# Patient Record
Sex: Male | Born: 1975
Health system: Southern US, Community
[De-identification: ages and names within clinical notes are randomized; demographics above are authoritative.]

## PROBLEM LIST (undated history)

## (undated) DIAGNOSIS — I10 Essential (primary) hypertension: Secondary | ICD-10-CM

## (undated) DIAGNOSIS — F429 Obsessive-compulsive disorder, unspecified: Secondary | ICD-10-CM

## (undated) DIAGNOSIS — E7800 Pure hypercholesterolemia, unspecified: Secondary | ICD-10-CM

## (undated) DIAGNOSIS — F319 Bipolar disorder, unspecified: Secondary | ICD-10-CM

## (undated) HISTORY — PX: LYMPH NODE BIOPSY: SHX201

---

## 2016-11-17 ENCOUNTER — Ambulatory Visit (INDEPENDENT_AMBULATORY_CARE_PROVIDER_SITE_OTHER): Payer: BLUE CROSS/BLUE SHIELD | Admitting: Psychiatry

## 2016-11-17 DIAGNOSIS — F422 Mixed obsessional thoughts and acts: Secondary | ICD-10-CM | POA: Diagnosis not present

## 2016-11-17 DIAGNOSIS — Z79899 Other long term (current) drug therapy: Secondary | ICD-10-CM | POA: Diagnosis not present

## 2016-11-17 DIAGNOSIS — F319 Bipolar disorder, unspecified: Secondary | ICD-10-CM | POA: Diagnosis not present

## 2016-11-17 DIAGNOSIS — Z818 Family history of other mental and behavioral disorders: Secondary | ICD-10-CM

## 2016-11-17 MED ORDER — FLUVOXAMINE MALEATE 100 MG PO TABS: 100.0000 mg | ORAL_TABLET | Freq: Two times a day (BID) | ORAL | 1 refills | Status: DC

## 2016-11-17 MED ORDER — TRAZODONE HCL 50 MG PO TABS: 50.0000 mg | ORAL_TABLET | Freq: Every evening | ORAL | 1 refills | Status: DC | PRN

## 2016-11-17 MED ORDER — LITHIUM CARBONATE ER 300 MG PO TBCR: 1200.0000 mg | EXTENDED_RELEASE_TABLET | Freq: Every day | ORAL | 1 refills | Status: DC

## 2016-11-17 NOTE — Progress Notes (Signed)
Psychiatric Initial Adult Assessment   Patient Identification: Cameron Steele MRN:  [id number] Date of Evaluation:  11/17/2016 Referral Source: self Chief Complaint:  bipolar med management Visit Diagnosis:    ICD-10-CM   1. Bipolar I disorder ([place]) F31.9 traZODone (DESYREL) 50 MG tablet    lithium carbonate (LITHOBID) 300 MG CR tablet    fluvoxaMINE (LUVOX) 100 MG tablet    Ambulatory referral to Psychology    TSH    T4, free    Testosterone Free, Profile I    Vitamin D 1,25 dihydroxy    CBC with Differential    Comprehensive metabolic panel    TSH    T4, free    Testosterone Free, Profile I    Vitamin D 1,25 dihydroxy    CBC with Differential    Comprehensive metabolic panel  2. Mixed obsessional thoughts and acts F42.2 Ambulatory referral to Psychology    TSH    T4, free    Testosterone Free, Profile I    Vitamin D 1,25 dihydroxy    CBC with Differential    Comprehensive metabolic panel    TSH    T4, free    Testosterone Free, Profile I    Vitamin D 1,25 dihydroxy    CBC with Differential    Comprehensive metabolic panel    History of Present Illness:  Cameron Steele is a 41 year old male with one psychiatric hospitalization in 2005 for bipolar mania with psychosis. His wife is present today for initial intake and he provided a very reliable and corroborated history of bipolar mania lasting about 1 week, including auditory and visual hallucinations, decreased need for sleep, increased goal-directed activity, paranoia, and grandiosity.  Since the hospitalization, he has remained on lithium 1200 mg nightly, and Luvox 100 mg twice daily. He has a long history of struggling with OCD since childhood, and as his mania symptoms improved, his outpatient psychiatrist eventually started him on Luvox for OCD symptoms. He describes a history of counting, and 5's, and checking. He describes mixed obsessional thoughts as well, and feeling things need to be "just so" otherwise he feels out of  balance and inefficient. He reports that the Luvox and lithium in combination have been very well for him, and tolerated without issue. He denies any suicide attempts and had passive suicidal thoughts after the acute manic episode and hospitalization.  He has a family history of bipolar disorder and his paternal grandmother. He reports that he has no other family history of mental illness. I spent time educating him and his wife about heritability of bipolar disorder, and signs and symptoms to be cognizant of as their children age. They have nonidentical twins, ages 55, and one 25-year-old.   I spent time with the patient reviewing some of his behaviors and strategies for coping with anxiety and day-to-day stresses. He struggles with very black and white thinking. I asked him to tell me about himself, he started with his Myers-Briggs identification, and had difficulty abstracting concepts of his identity of self, self-esteem, areas of strength and weakness. His wife reports that he is often like this, in terms of black and white thinking, not wanting to do certain things such as exercise unless he can commit to do it every day. He can become quite unsettled with taking on new activities and hobbies unless he is able to do them every single day.  I spent time with him discussing the utility of CBT for OCD symptoms, exposure and response prevention, and he was agreeable  to a referral for individual CBT. He has never had any meaningful and visual therapy, and met with a therapist only once in the past. I recommended we proceed with a short course of CBT, and made a referral to Dr. Cheryln Manly.    He was agreeable to continue on the current medications, and to follow up in 4 months or sooner if needed. We will also obtain laboratory studies for routine medication monitoring. I spent time reviewing safety issues, and resources should he have any acute crises.  I also spent time discussing the utility of  antipsychotics for acute bipolar depression, and the utility of ECT for acute bipolar depression if he comes to crisis in the future. I also spent time reviewing the risks and benefits of long-term lithium use, and considering some strategies in the future that would allow [place] to taper him off of lithium if tolerability issues arise.  Associated Signs/Symptoms: Depression Symptoms:  euthymic (Hypo) Manic Symptoms:  Impulsivity, Anxiety Symptoms:  Obsessive Compulsive Symptoms:   Checking, Counting,, Psychotic Symptoms:  none PTSD Symptoms: Negative  Past Psychiatric History: One psychiatric hospitalization in [place] in 2015, for acute mania with psychosis. No prior suicide attempts or other psychiatric hospitalizations. He had outpatient psychiatric management until he moved to [place] this year, he has been following up with his outpatient psychiatrist in [place] for the past years he has been looking for a new provider.  Previous Psychotropic Medications: Yes - lithium, Luvox, trazodone He had a past response of akathisia to risperidone  Substance Abuse History in the last 12 months:  No.  Consequences of Substance Abuse: Negative  Past Medical History: No past medical history on file. No past surgical history on file.  Family Psychiatric History: Family psychiatric history of bipolar disorder in his paternal grandmother  Family History: No family history on file.  Social History:   Social History   Social History  . Marital status: Married    Spouse name: N/A  . Number of children: N/A  . Years of education: N/A   Social History Main Topics  . Smoking status: Not on file  . Smokeless tobacco: Not on file  . Alcohol use Not on file  . Drug use: Unknown  . Sexual activity: Not on file   Other Topics Concern  . Not on file   Social History Narrative  . No narrative on file    Additional Social History: The patient is married 13 years, has 3  children, works for the [employer], in a management position. Financially secure. Currently in the [employer] program to obtain his master's degree  Allergies:  Not on File  Metabolic Disorder Labs: No results found for: HGBA1C, MPG No results found for: PROLACTIN No results found for: CHOL, TRIG, HDL, CHOLHDL, VLDL, LDLCALC   Current Medications: Current Outpatient Prescriptions  Medication Sig Dispense Refill  . fluvoxaMINE (LUVOX) 100 MG tablet Take 1 tablet (100 mg total) by mouth 2 (two) times daily. 180 tablet 1  . lithium carbonate (LITHOBID) 300 MG CR tablet Take 4 tablets (1,200 mg total) by mouth at bedtime. 360 tablet 1  . traZODone (DESYREL) 50 MG tablet Take 1 tablet (50 mg total) by mouth at bedtime as needed. 90 tablet 1   No current facility-administered medications for this visit.     Neurologic: Headache: Negative Seizure: Negative Paresthesias:Negative  Musculoskeletal: Strength & Muscle Tone: within normal limits Gait & Station: normal Patient leans: N/A  Psychiatric Specialty Exam:  Review of Systems  Constitutional: Negative.   HENT: Negative.   Respiratory: Negative.   Cardiovascular: Negative.   Gastrointestinal: Negative.   Musculoskeletal: Negative.   Neurological: Negative.   Psychiatric/Behavioral: Negative for depression, hallucinations, memory loss, substance abuse and suicidal ideas. The patient is not nervous/anxious and does not have insomnia.     There were no vitals taken for this visit.There is no height or weight on file to calculate BMI.  General Appearance: Casual and Well Groomed  Eye Contact:  Good  Speech:  Clear and Coherent  Volume:  Normal  Mood:  Euthymic  Affect:  Congruent  Thought Process:  Goal Directed  Orientation:  Full (Time, Place, and Person)  Thought Content:  Logical  Suicidal Thoughts:  No  Homicidal Thoughts:  No  Memory:  Immediate;   Good  Judgement:  Good  Insight:  Good   Psychomotor Activity:  Normal  Concentration:  Attention Span: Good  Recall:  Good  Fund of Knowledge:Good  Language: Good  Akathisia:  Negative  Handed:  Right  AIMS (if indicated):  0  Assets:  Communication Skills Desire for Improvement Financial Resources/Insurance Housing Intimacy Leisure Time Physical Health Resilience Social Support Talents/Skills Transportation Vocational/Educational  ADL's:  Intact  Cognition: WNL  Sleep:  6 hours with trazodone    Treatment Plan Summary: Kailyn Dubie is a 41 year old male with a psychiatric history very consistent with bipolar 1 disorder, with a fairly classical presentation of bipolar mania followed by periods of depression. He has a biological history of bipolar disorder in his paternal grandmother.  He has never had more than 1 episode of acute mania, which occurred 3 years ago. The vast majority of patients with bipolar disorder will experience a repeat episode of mania, so we will need to be cognizant of any acute mood or affect of changes. He appears to be stable on lithium and Luvox, so we will continue at the current doses and obtain laboratory monitoring. He presents with a fairly concrete and rigid sense of himself both internally and interpersonally, and struggles with comorbid OCD which appears to have been present since childhood. He has never had any psychotherapy for treatment of OCD, and would benefit from cognitive behavioral therapy. He is agreeable to the recommendations as discussed and does not present any acute safety issues or substance abuse. He has an excellent support system and consistent employment.  1. Bipolar I disorder ([place])   2. Mixed obsessional thoughts and acts    Continue lithium 1200 mg nightly Continue trazodone 50 mg nightly for sleep Continue Luvox 100 mg twice daily for OCD Obtain CMP, CBC, TSH, free T4, vitamin D, free testosterone RTC 4 months Referral for individual CBT  I spent 1 hour with  the patient in direct care and counseling. We discussed psychiatric medication management and treatment planning moving forward including initiation of individual therapy.    Aundra Dubin, MD 10/2/201810:00 AM

## 2016-11-17 NOTE — Addendum Note (Signed)
Addended by: Rosalita Levan on: 11/17/2016 04:29 PM   Modules accepted: Orders

## 2016-11-18 LAB — LITHIUM LEVEL: Lithium Lvl: 0.6 mmol/L (ref 0.6–1.2)

## 2016-11-20 LAB — CBC WITH DIFFERENTIAL/PLATELET
BASOS ABS: 0 10*3/uL (ref 0.0–0.2)
BASOS: 0 %
EOS (ABSOLUTE): 0.2 10*3/uL (ref 0.0–0.4)
Eos: 3 %
Hematocrit: 44.6 % (ref 37.5–51.0)
Hemoglobin: 15.1 g/dL (ref 13.0–17.7)
IMMATURE GRANS (ABS): 0 10*3/uL (ref 0.0–0.1)
IMMATURE GRANULOCYTES: 1 %
LYMPHS: 19 %
Lymphocytes Absolute: 1.5 10*3/uL (ref 0.7–3.1)
MCH: 28.8 pg (ref 26.6–33.0)
MCHC: 33.9 g/dL (ref 31.5–35.7)
MCV: 85 fL (ref 79–97)
MONOS ABS: 0.4 10*3/uL (ref 0.1–0.9)
Monocytes: 5 %
NEUTROS PCT: 72 %
Neutrophils Absolute: 5.6 10*3/uL (ref 1.4–7.0)
PLATELETS: 207 10*3/uL (ref 150–379)
RBC: 5.24 x10E6/uL (ref 4.14–5.80)
RDW: 13.6 % (ref 12.3–15.4)
WBC: 7.8 10*3/uL (ref 3.4–10.8)

## 2016-11-20 LAB — COMPREHENSIVE METABOLIC PANEL
A/G RATIO: 1.6 (ref 1.2–2.2)
ALT: 22 IU/L (ref 0–44)
AST: 26 IU/L (ref 0–40)
Albumin: 4.6 g/dL (ref 3.5–5.5)
Alkaline Phosphatase: 110 IU/L (ref 39–117)
BUN/Creatinine Ratio: 15 (ref 9–20)
BUN: 15 mg/dL (ref 6–24)
Bilirubin Total: 0.2 mg/dL (ref 0.0–1.2)
CALCIUM: 9.4 mg/dL (ref 8.7–10.2)
CHLORIDE: 101 mmol/L (ref 96–106)
CO2: 17 mmol/L — ABNORMAL LOW (ref 20–29)
Creatinine, Ser: 1.02 mg/dL (ref 0.76–1.27)
GFR calc Af Amer: 105 mL/min/{1.73_m2} (ref >59)
GFR, EST NON AFRICAN AMERICAN: 91 mL/min/{1.73_m2} (ref >59)
Globulin, Total: 2.8 g/dL (ref 1.5–4.5)
POTASSIUM: 4 mmol/L (ref 3.5–5.2)
Sodium: 143 mmol/L (ref 134–144)
TOTAL PROTEIN: 7.4 g/dL (ref 6.0–8.5)

## 2016-11-20 LAB — VITAMIN D 1,25 DIHYDROXY

## 2016-11-20 LAB — TESTOSTERONE FREE, PROFILE I

## 2016-11-20 LAB — TSH: TSH: 2.05 u[IU]/mL (ref 0.450–4.500)

## 2016-11-20 LAB — T4, FREE: FREE T4: 1.19 ng/dL (ref 0.82–1.77)

## 2016-11-23 LAB — TESTOSTERONE FREE, PROFILE I
ALBUMIN: 4.6 g/dL (ref 3.5–5.5)
SEX HORMONE BINDING: 45.1 nmol/L (ref 16.5–55.9)
TESTOST., FREE, CALC: 60.2 pg/mL (ref 30.3–183.2)
TESTOSTERONE: 375 ng/dL (ref 264–916)

## 2016-11-23 LAB — VITAMIN D 1,25 DIHYDROXY
VITAMIN D 1, 25 (OH) TOTAL: 50 pg/mL
VITAMIN D3 1, 25 (OH): 50 pg/mL

## 2016-11-23 LAB — SPECIMEN STATUS REPORT

## 2017-02-02 ENCOUNTER — Ambulatory Visit (HOSPITAL_COMMUNITY): Payer: BLUE CROSS/BLUE SHIELD | Admitting: Psychiatry

## 2017-04-26 ENCOUNTER — Encounter (HOSPITAL_COMMUNITY): Payer: Self-pay | Admitting: Psychiatry

## 2017-04-26 ENCOUNTER — Ambulatory Visit (INDEPENDENT_AMBULATORY_CARE_PROVIDER_SITE_OTHER): Payer: PRIVATE HEALTH INSURANCE | Admitting: Psychiatry

## 2017-04-26 VITALS — BP 114/78 | HR 62 | Ht 71.0 in | Wt 217.0 lb

## 2017-04-26 DIAGNOSIS — F422 Mixed obsessional thoughts and acts: Secondary | ICD-10-CM | POA: Diagnosis not present

## 2017-04-26 DIAGNOSIS — F319 Bipolar disorder, unspecified: Secondary | ICD-10-CM | POA: Diagnosis not present

## 2017-04-26 MED ORDER — FLUVOXAMINE MALEATE 100 MG PO TABS: 100.0000 mg | ORAL_TABLET | Freq: Every day | ORAL | 1 refills | Status: DC

## 2017-04-26 MED ORDER — TRAZODONE HCL 50 MG PO TABS: 50.0000 mg | ORAL_TABLET | Freq: Every evening | ORAL | 1 refills | Status: DC | PRN

## 2017-04-26 MED ORDER — LITHIUM CARBONATE ER 300 MG PO TBCR: 1200.0000 mg | EXTENDED_RELEASE_TABLET | Freq: Every day | ORAL | 1 refills | Status: DC

## 2017-04-26 MED ORDER — METFORMIN HCL 500 MG PO TABS: 500.0000 mg | ORAL_TABLET | Freq: Every day | ORAL | 1 refills | Status: DC

## 2017-04-26 NOTE — Progress Notes (Signed)
BH MD/PA/NP OP Progress Note  04/26/2017 3:32 PM Cameron PluckCasey Steele  MRN:  [id number]  Chief Complaint: med check  HPI: Patient presents with stable mood, stable sleep.  I spent time discussing some of the risks and benefits of weight gain associated with lithium.  We discussed the use of metformin to help with weight loss and diet reduction associated with medication induced weight gain.  He was agreeable to this.  He denies any acute safety issues.  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  Wife reports that she has no significant concerns at this time.  We spent time discussing the importance of balancing family, work, education.  Visit Diagnosis:    ICD-10-CM   1. Mixed obsessional thoughts and acts F42.2   2. Bipolar I disorder ([facility]) F31.9 lithium carbonate (LITHOBID) 300 MG CR tablet    fluvoxaMINE (LUVOX) 100 MG tablet    traZODone (DESYREL) 50 MG tablet    Past Psychiatric History: See intake H&P for full details. Reviewed, with no updates at this time.   Past Medical History: History reviewed. No pertinent past medical history. History reviewed. No pertinent surgical history.  Family Psychiatric History: See intake H&P for full details. Reviewed, with no updates at this time.   Family History: History reviewed. No pertinent family history.  Social History:  Social History   Socioeconomic History  . Marital status: Married    Spouse name: None  . Number of children: None  . Years of education: None  . Highest education level: None  Social Needs  . Financial resource strain: None  . Food insecurity - worry: None  . Food insecurity - inability: None  . Transportation needs - medical: None  . Transportation needs - non-medical: None  Occupational History  . None  Tobacco Use  . Smoking status: Never Smoker  . Smokeless tobacco: Never Used  Substance and Sexual Activity  . Alcohol  use: No    Frequency: Never  . Drug use: No  . Sexual activity: None  Other Topics Concern  . None  Social History Narrative  . None    Allergies: Not on File  Metabolic Disorder Labs: No results found for: HGBA1C, MPG No results found for: PROLACTIN No results found for: CHOL, TRIG, HDL, CHOLHDL, VLDL, LDLCALC Lab Results  Component Value Date   TSH 2.050 11/17/2016    Therapeutic Level Labs: Lab Results  Component Value Date   LITHIUM 0.6 11/17/2016   No results found for: VALPROATE No components found for:  CBMZ  Current Medications: Current Outpatient Medications  Medication Sig Dispense Refill  . fluvoxaMINE (LUVOX) 100 MG tablet Take 1 tablet (100 mg total) by mouth at bedtime. 90 tablet 1  . lithium carbonate (LITHOBID) 300 MG CR tablet Take 4 tablets (1,200 mg total) by mouth at bedtime. 360 tablet 1  . traZODone (DESYREL) 50 MG tablet Take 1 tablet (50 mg total) by mouth at bedtime as needed. 90 tablet 1  . metFORMIN (GLUCOPHAGE) 500 MG tablet Take 1 tablet (500 mg total) by mouth daily with breakfast. 90 tablet 1   No current facility-administered medications for this visit.      Musculoskeletal: Strength & Muscle Tone: within normal limits Gait & Station: normal Patient leans: N/A  Psychiatric Specialty Exam: ROS  Blood pressure 114/78, pulse 62, height 5\' 11"  (1.803 m), weight 217 lb (98.4 kg), SpO2 95 %.Body mass  index is 30.27 kg/m.  General Appearance: Casual and Well Groomed  Eye Contact:  Good  Speech:  Clear and Coherent and Normal Rate  Volume:  Normal  Mood:  Euthymic  Affect:  Appropriate and Congruent  Thought Process:  Goal Directed and Descriptions of Associations: Intact  Orientation:  Full (Time, Place, and Person)  Thought Content: Logical   Suicidal Thoughts:  No  Homicidal Thoughts:  No  Memory:  Immediate;   Good  Judgement:  Fair  Insight:  Fair  Psychomotor Activity:  Normal  Concentration:  Concentration: Good  Recall:   Good  Fund of Knowledge: Good  Language: Good  Akathisia:  Negative  Handed:  Right  AIMS (if indicated): not done  Assets:  Communication Skills Desire for Improvement Financial Resources/Insurance Housing Intimacy Leisure Time Physical Health Resilience Social Support Talents/Skills Transportation Vocational/Educational  ADL's:  Intact  Cognition: WNL  Sleep:  Good   Screenings:   Assessment and Plan:  Cameron Steele Presents with stable mood on the medication regimen as below.  He has had some weight gain associated with lithium, so we agreed to start metformin to help with metabolic symptoms.  Spent time discussing his work, family, and educational goals, and discussing the importance of balancing.  Discussed self-care including daily exercise.  No acute safety issues, we will follow-up in 3 months and recheck laboratory studies at that time.  1. Mixed obsessional thoughts and acts   2. Bipolar I disorder (H[facility])     Status of current problems: stable  Labs Ordered: No orders of the defined types were placed in this encounter.   Labs Reviewed: n/a  Collateral Obtained/Records Reviewed: wife is present, she is able to corroborate that he has been stable and not with any acute safety issues, medication adherent  Plan:  Continue lithium, luvox, trazodone Notably, he has been taking Luvox 100 mg nightly (instead of BID) and feels stable at that dose Initiate metformin 500 mg daily with breakfast  I spent 25 minutes with the patient in direct face-to-face clinical care.  Greater than 50% of this time was spent in counseling and coordination of care with the patient.    Burnard Leigh, MD 04/26/2017, 3:32 PM

## 2017-08-31 ENCOUNTER — Ambulatory Visit (HOSPITAL_COMMUNITY): Payer: PRIVATE HEALTH INSURANCE | Admitting: Psychiatry

## 2017-09-09 ENCOUNTER — Ambulatory Visit (INDEPENDENT_AMBULATORY_CARE_PROVIDER_SITE_OTHER): Payer: PRIVATE HEALTH INSURANCE | Admitting: Psychiatry

## 2017-09-09 VITALS — BP 140/90 | HR 90 | Ht 71.0 in | Wt 260.0 lb

## 2017-09-09 DIAGNOSIS — Z79899 Other long term (current) drug therapy: Secondary | ICD-10-CM

## 2017-09-09 DIAGNOSIS — F319 Bipolar disorder, unspecified: Secondary | ICD-10-CM | POA: Diagnosis not present

## 2017-09-09 MED ORDER — METFORMIN HCL 500 MG PO TABS: 500.0000 mg | ORAL_TABLET | Freq: Every day | ORAL | 1 refills | Status: AC

## 2017-09-09 MED ORDER — LITHIUM CARBONATE ER 300 MG PO TBCR: 1200.0000 mg | EXTENDED_RELEASE_TABLET | Freq: Every day | ORAL | 1 refills | Status: DC

## 2017-09-09 MED ORDER — TRAZODONE HCL 50 MG PO TABS: 50.0000 mg | ORAL_TABLET | Freq: Every evening | ORAL | 1 refills | Status: AC | PRN

## 2017-09-09 MED ORDER — FLUVOXAMINE MALEATE 100 MG PO TABS: 100.0000 mg | ORAL_TABLET | Freq: Every day | ORAL | 1 refills | Status: AC

## 2017-09-09 NOTE — Progress Notes (Signed)
BH MD/PA/NP OP Progress Note  09/10/2017 9:52 AM Cameron Steele  MRN:  [id number]  Chief Complaint: med check  HPI: Cameron Steele reports good mood stability, good tolerability of the medication regimen, and overall has no complaints.  His wife is present and reports that Cameron Steele has been doing great, his mood has been stable and he seems to be on a really good path over the past year.  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  He sleeps well at night with the trazodone, denies any acute concerns.  We agreed to obtain laboratory studies today for routine medication monitoring and we will follow-up in 3-4 months or sooner if needed.  Disclosed to patient that this Clinical research associate is leaving this practice at the end of August 2019, and patients always has the right to choose their provider. Reassured patient that office will work to provide smooth transition of care whether they wish to remain at this office, or to continue with this provider, or seek alternative care options in community.  They expressed understanding.  Visit Diagnosis:    ICD-10-CM   1. Encounter for long-term (current) use of medications Z79.899 Lithium level    Comprehensive metabolic panel    TSH + free T4    CBC with Differential    Vitamin D 1,25 dihydroxy    Vitamin D 1,25 dihydroxy    CBC with Differential    TSH + free T4    Comprehensive metabolic panel    Lithium level  2. Bipolar I disorder (HCC) F31.9 fluvoxaMINE (LUVOX) 100 MG tablet    traZODone (DESYREL) 50 MG tablet    lithium carbonate (LITHOBID) 300 MG CR tablet    metFORMIN (GLUCOPHAGE) 500 MG tablet    Lithium level    Comprehensive metabolic panel    TSH + free T4    CBC with Differential    Vitamin D 1,25 dihydroxy    Vitamin D 1,25 dihydroxy    CBC with Differential    TSH + free T4    Comprehensive metabolic panel    Lithium level   Past Psychiatric History: See intake H&P for full  details. Reviewed, with no updates at this time.  Past Medical History: No past medical history on file. No past surgical history on file.  Family Psychiatric History: See intake H&P for full details. Reviewed, with no updates at this time.  Family History: No family history on file.  Social History:  Social History   Socioeconomic History  . Marital status: Married    Spouse name: Not on file  . Number of children: Not on file  . Years of education: Not on file  . Highest education level: Not on file  Occupational History  . Not on file  Social Needs  . Financial resource strain: Not on file  . Food insecurity:    Worry: Not on file    Inability: Not on file  . Transportation needs:    Medical: Not on file    Non-medical: Not on file  Tobacco Use  . Smoking status: Never Smoker  . Smokeless tobacco: Never Used  Substance and Sexual Activity  . Alcohol use: No    Frequency: Never  . Drug use: No  . Sexual activity: Not on file  Lifestyle  . Physical activity:    Days per week: Not on file    Minutes per session: Not on file  .  Stress: Not on file  Relationships  . Social connections:    Talks on phone: Not on file    Gets together: Not on file    Attends religious service: Not on file    Active member of club or organization: Not on file    Attends meetings of clubs or organizations: Not on file    Relationship status: Not on file  Other Topics Concern  . Not on file  Social History Narrative  . Not on file    Allergies: Not on File  Metabolic Disorder Labs: No results found for: HGBA1C, MPG No results found for: PROLACTIN No results found for: CHOL, TRIG, HDL, CHOLHDL, VLDL, LDLCALC Lab Results  Component Value Date   TSH 3.000 09/09/2017   TSH 2.050 11/17/2016    Therapeutic Level Labs: Lab Results  Component Value Date   LITHIUM 0.6 09/09/2017   LITHIUM 0.6 11/17/2016   No results found for: VALPROATE No components found for:   CBMZ  Current Medications: Current Outpatient Medications  Medication Sig Dispense Refill  . fluvoxaMINE (LUVOX) 100 MG tablet Take 1 tablet (100 mg total) by mouth at bedtime. 90 tablet 1  . lithium carbonate (LITHOBID) 300 MG CR tablet Take 4 tablets (1,200 mg total) by mouth at bedtime. 360 tablet 1  . metFORMIN (GLUCOPHAGE) 500 MG tablet Take 1 tablet (500 mg total) by mouth daily with breakfast. 90 tablet 1  . traZODone (DESYREL) 50 MG tablet Take 1 tablet (50 mg total) by mouth at bedtime as needed. 90 tablet 1   No current facility-administered medications for this visit.      Musculoskeletal: Strength & Muscle Tone: within normal limits Gait & Station: normal Patient leans: N/A  Psychiatric Specialty Exam: ROS  Blood pressure 140/90, pulse 90, height 5\' 11"  (1.803 m), weight 260 lb (117.9 kg).Body mass index is 36.26 kg/m.  General Appearance: Casual and Well Groomed  Eye Contact:  Good  Speech:  Clear and Coherent and Normal Rate  Volume:  Normal  Mood:  Euthymic  Affect:  Appropriate and Congruent  Thought Process:  Goal Directed and Descriptions of Associations: Intact  Orientation:  Full (Time, Place, and Person)  Thought Content: Logical   Suicidal Thoughts:  No  Homicidal Thoughts:  No  Memory:  Immediate;   Good  Judgement:  Fair  Insight:  Fair  Psychomotor Activity:  Normal  Concentration:  Concentration: Good  Recall:  Good  Fund of Knowledge: Good  Language: Good  Akathisia:  Negative  Handed:  Right  AIMS (if indicated): not done  Assets:  Communication Skills Desire for Improvement Financial Resources/Insurance Housing Intimacy Leisure Time Physical Health Resilience Social Support Talents/Skills Transportation Vocational/Educational  ADL's:  Intact  Cognition: WNL  Sleep:  Good   Assessment and Plan:  Cameron Steele Presents with stable mood.  He has lost some weight with the addition of metformin.  He is overall quite stable in terms of  his mood and sleep symptoms, and we discussed a plan to continue on the current medication regimen for long-term care.  We will assess laboratory monitoring today for routine medication management.  1. Encounter for long-term (current) use of medications   2. Bipolar I disorder ([facility]CC)     Status of current problems: stable  Labs Ordered: Orders Placed This Encounter  Procedures  . Lithium level    Standing Status:   Future    Number of Occurrences:   1    Standing Expiration Date:  09/10/2018  . Comprehensive metabolic panel    Standing Status:   Future    Number of Occurrences:   1    Standing Expiration Date:   09/10/2018  . TSH + free T4    Standing Status:   Future    Number of Occurrences:   1    Standing Expiration Date:   09/10/2018  . CBC with Differential    Standing Status:   Future    Number of Occurrences:   1    Standing Expiration Date:   09/10/2018  . Vitamin D 1,25 dihydroxy    Standing Status:   Future    Number of Occurrences:   1    Standing Expiration Date:   09/10/2018   Labs Reviewed: n/a  Collateral Obtained/Records Reviewed: wife is present and reports things have been going quite well with him and his mood, and reports overall good stability  Plan:  Continue lithium, luvox, trazodone, luvox Continue metformin 500 mg nightly  Burnard Leigh, MD 09/10/2017, 9:52 AM

## 2017-09-15 LAB — CBC WITH DIFFERENTIAL/PLATELET
BASOS ABS: 0 10*3/uL (ref 0.0–0.2)
Basos: 0 %
EOS (ABSOLUTE): 0.3 10*3/uL (ref 0.0–0.4)
Eos: 4 %
HEMOGLOBIN: 15 g/dL (ref 13.0–17.7)
Hematocrit: 43.8 % (ref 37.5–51.0)
IMMATURE GRANS (ABS): 0 10*3/uL (ref 0.0–0.1)
IMMATURE GRANULOCYTES: 0 %
LYMPHS: 23 %
Lymphocytes Absolute: 1.7 10*3/uL (ref 0.7–3.1)
MCH: 29.1 pg (ref 26.6–33.0)
MCHC: 34.2 g/dL (ref 31.5–35.7)
MCV: 85 fL (ref 79–97)
Monocytes Absolute: 0.6 10*3/uL (ref 0.1–0.9)
Monocytes: 8 %
NEUTROS PCT: 65 %
Neutrophils Absolute: 4.9 10*3/uL (ref 1.4–7.0)
PLATELETS: 244 10*3/uL (ref 150–450)
RBC: 5.15 x10E6/uL (ref 4.14–5.80)
RDW: 13.9 % (ref 12.3–15.4)
WBC: 7.5 10*3/uL (ref 3.4–10.8)

## 2017-09-15 LAB — COMPREHENSIVE METABOLIC PANEL
ALK PHOS: 101 IU/L (ref 39–117)
ALT: 31 IU/L (ref 0–44)
AST: 23 IU/L (ref 0–40)
Albumin/Globulin Ratio: 2.3 — ABNORMAL HIGH (ref 1.2–2.2)
Albumin: 4.8 g/dL (ref 3.5–5.5)
BUN/Creatinine Ratio: 12 (ref 9–20)
BUN: 13 mg/dL (ref 6–24)
Bilirubin Total: 0.2 mg/dL (ref 0.0–1.2)
CALCIUM: 9.6 mg/dL (ref 8.7–10.2)
CO2: 19 mmol/L — AB (ref 20–29)
CREATININE: 1.13 mg/dL (ref 0.76–1.27)
Chloride: 105 mmol/L (ref 96–106)
GFR calc Af Amer: 92 mL/min/{1.73_m2} (ref >59)
GFR calc non Af Amer: 80 mL/min/{1.73_m2} (ref >59)
GLOBULIN, TOTAL: 2.1 g/dL (ref 1.5–4.5)
GLUCOSE: 95 mg/dL (ref 65–99)
Potassium: 4.5 mmol/L (ref 3.5–5.2)
SODIUM: 144 mmol/L (ref 134–144)
Total Protein: 6.9 g/dL (ref 6.0–8.5)

## 2017-09-15 LAB — TSH+FREE T4
FREE T4: 1.04 ng/dL (ref 0.82–1.77)
TSH: 3 u[IU]/mL (ref 0.450–4.500)

## 2017-09-15 LAB — VITAMIN D 1,25 DIHYDROXY
VITAMIN D3 1, 25 (OH): 27 pg/mL
Vitamin D 1, 25 (OH)2 Total: 32 pg/mL

## 2017-09-15 LAB — LITHIUM LEVEL: LITHIUM LVL: 0.6 mmol/L (ref 0.6–1.2)

## 2018-09-15 DIAGNOSIS — F3174 Bipolar disorder, in full remission, most recent episode manic: Secondary | ICD-10-CM | POA: Diagnosis not present

## 2018-10-21 DIAGNOSIS — Z5181 Encounter for therapeutic drug level monitoring: Secondary | ICD-10-CM | POA: Diagnosis not present

## 2018-12-25 DIAGNOSIS — S63502A Unspecified sprain of left wrist, initial encounter: Secondary | ICD-10-CM | POA: Diagnosis not present

## 2019-01-31 DIAGNOSIS — Z20828 Contact with and (suspected) exposure to other viral communicable diseases: Secondary | ICD-10-CM | POA: Diagnosis not present

## 2019-01-31 DIAGNOSIS — Z6829 Body mass index (BMI) 29.0-29.9, adult: Secondary | ICD-10-CM | POA: Diagnosis not present

## 2019-02-07 DIAGNOSIS — R519 Headache, unspecified: Secondary | ICD-10-CM | POA: Diagnosis not present

## 2019-02-07 DIAGNOSIS — Z20828 Contact with and (suspected) exposure to other viral communicable diseases: Secondary | ICD-10-CM | POA: Diagnosis not present

## 2019-02-07 DIAGNOSIS — R110 Nausea: Secondary | ICD-10-CM | POA: Diagnosis not present

## 2019-02-07 DIAGNOSIS — R6883 Chills (without fever): Secondary | ICD-10-CM | POA: Diagnosis not present

## 2019-02-15 DIAGNOSIS — Z20828 Contact with and (suspected) exposure to other viral communicable diseases: Secondary | ICD-10-CM | POA: Diagnosis not present

## 2019-03-13 DIAGNOSIS — F3174 Bipolar disorder, in full remission, most recent episode manic: Secondary | ICD-10-CM | POA: Diagnosis not present

## 2019-03-24 DIAGNOSIS — Z5181 Encounter for therapeutic drug level monitoring: Secondary | ICD-10-CM | POA: Diagnosis not present

## 2019-03-24 DIAGNOSIS — F429 Obsessive-compulsive disorder, unspecified: Secondary | ICD-10-CM | POA: Diagnosis not present

## 2019-03-24 DIAGNOSIS — F3174 Bipolar disorder, in full remission, most recent episode manic: Secondary | ICD-10-CM | POA: Diagnosis not present

## 2019-05-16 DIAGNOSIS — S20362A Insect bite (nonvenomous) of left front wall of thorax, initial encounter: Secondary | ICD-10-CM | POA: Diagnosis not present

## 2019-05-16 DIAGNOSIS — R21 Rash and other nonspecific skin eruption: Secondary | ICD-10-CM | POA: Diagnosis not present

## 2019-08-10 DIAGNOSIS — Z0000 Encounter for general adult medical examination without abnormal findings: Secondary | ICD-10-CM | POA: Diagnosis not present

## 2019-08-10 DIAGNOSIS — Z113 Encounter for screening for infections with a predominantly sexual mode of transmission: Secondary | ICD-10-CM | POA: Diagnosis not present

## 2019-08-10 DIAGNOSIS — R739 Hyperglycemia, unspecified: Secondary | ICD-10-CM | POA: Diagnosis not present

## 2019-08-10 DIAGNOSIS — Z13220 Encounter for screening for lipoid disorders: Secondary | ICD-10-CM | POA: Diagnosis not present

## 2019-08-10 DIAGNOSIS — Z1159 Encounter for screening for other viral diseases: Secondary | ICD-10-CM | POA: Diagnosis not present

## 2019-08-10 DIAGNOSIS — M25532 Pain in left wrist: Secondary | ICD-10-CM | POA: Diagnosis not present

## 2019-08-10 DIAGNOSIS — Z23 Encounter for immunization: Secondary | ICD-10-CM | POA: Diagnosis not present

## 2019-08-10 DIAGNOSIS — N529 Male erectile dysfunction, unspecified: Secondary | ICD-10-CM | POA: Diagnosis not present

## 2019-08-10 DIAGNOSIS — I10 Essential (primary) hypertension: Secondary | ICD-10-CM | POA: Diagnosis not present

## 2019-09-18 DIAGNOSIS — F3174 Bipolar disorder, in full remission, most recent episode manic: Secondary | ICD-10-CM | POA: Diagnosis not present

## 2019-10-03 DIAGNOSIS — E7800 Pure hypercholesterolemia, unspecified: Secondary | ICD-10-CM | POA: Diagnosis not present

## 2019-10-03 DIAGNOSIS — E786 Lipoprotein deficiency: Secondary | ICD-10-CM | POA: Diagnosis not present

## 2019-10-03 DIAGNOSIS — I10 Essential (primary) hypertension: Secondary | ICD-10-CM | POA: Diagnosis not present

## 2019-10-03 DIAGNOSIS — N529 Male erectile dysfunction, unspecified: Secondary | ICD-10-CM | POA: Diagnosis not present

## 2019-10-04 DIAGNOSIS — M25532 Pain in left wrist: Secondary | ICD-10-CM | POA: Insufficient documentation

## 2019-10-06 DIAGNOSIS — M25532 Pain in left wrist: Secondary | ICD-10-CM | POA: Diagnosis not present

## 2019-10-17 DIAGNOSIS — M25532 Pain in left wrist: Secondary | ICD-10-CM | POA: Diagnosis not present

## 2019-10-20 DIAGNOSIS — M25532 Pain in left wrist: Secondary | ICD-10-CM | POA: Diagnosis not present

## 2019-10-25 DIAGNOSIS — R05 Cough: Secondary | ICD-10-CM | POA: Diagnosis not present

## 2019-10-25 DIAGNOSIS — Z03818 Encounter for observation for suspected exposure to other biological agents ruled out: Secondary | ICD-10-CM | POA: Diagnosis not present

## 2019-10-25 DIAGNOSIS — J329 Chronic sinusitis, unspecified: Secondary | ICD-10-CM | POA: Diagnosis not present

## 2019-10-25 DIAGNOSIS — R0981 Nasal congestion: Secondary | ICD-10-CM | POA: Diagnosis not present

## 2019-10-25 DIAGNOSIS — J029 Acute pharyngitis, unspecified: Secondary | ICD-10-CM | POA: Diagnosis not present

## 2019-11-03 DIAGNOSIS — R509 Fever, unspecified: Secondary | ICD-10-CM | POA: Diagnosis not present

## 2019-11-03 DIAGNOSIS — I10 Essential (primary) hypertension: Secondary | ICD-10-CM | POA: Diagnosis not present

## 2019-11-04 ENCOUNTER — Other Ambulatory Visit: Payer: Self-pay

## 2019-11-04 ENCOUNTER — Emergency Department (HOSPITAL_COMMUNITY): Payer: BC Managed Care – PPO

## 2019-11-04 ENCOUNTER — Emergency Department (HOSPITAL_COMMUNITY)
Admission: EM | Admit: 2019-11-04 | Discharge: 2019-11-04 | Disposition: A | Discharge status: Home / Self Care | Payer: BC Managed Care – PPO | Attending: Emergency Medicine | Admitting: Emergency Medicine

## 2019-11-04 ENCOUNTER — Ambulatory Visit (HOSPITAL_COMMUNITY)
Admission: EM | Admit: 2019-11-04 | Discharge: 2019-11-04 | Disposition: A | Discharge status: Home / Self Care | Payer: Self-pay

## 2019-11-04 ENCOUNTER — Encounter (HOSPITAL_COMMUNITY): Payer: Self-pay | Admitting: Emergency Medicine

## 2019-11-04 DIAGNOSIS — Z20822 Contact with and (suspected) exposure to covid-19: Secondary | ICD-10-CM | POA: Insufficient documentation

## 2019-11-04 DIAGNOSIS — J069 Acute upper respiratory infection, unspecified: Secondary | ICD-10-CM | POA: Insufficient documentation

## 2019-11-04 DIAGNOSIS — J9811 Atelectasis: Secondary | ICD-10-CM | POA: Diagnosis not present

## 2019-11-04 DIAGNOSIS — R590 Localized enlarged lymph nodes: Secondary | ICD-10-CM | POA: Diagnosis not present

## 2019-11-04 DIAGNOSIS — Z79899 Other long term (current) drug therapy: Secondary | ICD-10-CM | POA: Insufficient documentation

## 2019-11-04 DIAGNOSIS — R509 Fever, unspecified: Secondary | ICD-10-CM | POA: Diagnosis not present

## 2019-11-04 DIAGNOSIS — R079 Chest pain, unspecified: Secondary | ICD-10-CM | POA: Diagnosis not present

## 2019-11-04 DIAGNOSIS — I10 Essential (primary) hypertension: Secondary | ICD-10-CM | POA: Diagnosis not present

## 2019-11-04 DIAGNOSIS — J029 Acute pharyngitis, unspecified: Secondary | ICD-10-CM | POA: Diagnosis not present

## 2019-11-04 DIAGNOSIS — R0602 Shortness of breath: Secondary | ICD-10-CM | POA: Diagnosis not present

## 2019-11-04 HISTORY — DX: Essential (primary) hypertension: I10

## 2019-11-04 HISTORY — DX: Pure hypercholesterolemia, unspecified: E78.00

## 2019-11-04 LAB — COMPREHENSIVE METABOLIC PANEL
ALT: 24 U/L (ref 0–44)
AST: 16 U/L (ref 15–41)
Albumin: 3.5 g/dL (ref 3.5–5.0)
Alkaline Phosphatase: 75 U/L (ref 38–126)
Anion gap: 9 (ref 5–15)
BUN: 10 mg/dL (ref 6–20)
CO2: 23 mmol/L (ref 22–32)
Calcium: 9.1 mg/dL (ref 8.9–10.3)
Chloride: 105 mmol/L (ref 98–111)
Creatinine, Ser: 1.2 mg/dL (ref 0.61–1.24)
GFR calc Af Amer: >60 mL/min (ref >60)
GFR calc non Af Amer: >60 mL/min (ref >60)
Glucose, Bld: 109 mg/dL — ABNORMAL HIGH (ref 70–99)
Potassium: 4.6 mmol/L (ref 3.5–5.1)
Sodium: 137 mmol/L (ref 135–145)
Total Bilirubin: 0.8 mg/dL (ref 0.3–1.2)
Total Protein: 7.2 g/dL (ref 6.5–8.1)

## 2019-11-04 LAB — LACTIC ACID, PLASMA: Lactic Acid, Venous: 0.7 mmol/L (ref 0.5–1.9)

## 2019-11-04 LAB — CBC
HCT: 44.7 % (ref 39.0–52.0)
Hemoglobin: 14.7 g/dL (ref 13.0–17.0)
MCH: 27.6 pg (ref 26.0–34.0)
MCHC: 32.9 g/dL (ref 30.0–36.0)
MCV: 84 fL (ref 80.0–100.0)
Platelets: 245 10*3/uL (ref 150–400)
RBC: 5.32 MIL/uL (ref 4.22–5.81)
RDW: 13.8 % (ref 11.5–15.5)
WBC: 10.5 10*3/uL (ref 4.0–10.5)
nRBC: 0 % (ref 0.0–0.2)

## 2019-11-04 LAB — MONONUCLEOSIS SCREEN: Mono Screen: NEGATIVE

## 2019-11-04 LAB — GROUP A STREP BY PCR: Group A Strep by PCR: NOT DETECTED

## 2019-11-04 LAB — SARS CORONAVIRUS 2 BY RT PCR (HOSPITAL ORDER, PERFORMED IN ~~LOC~~ HOSPITAL LAB): SARS Coronavirus 2: NEGATIVE

## 2019-11-04 MED ORDER — PREDNISONE 20 MG PO TABS
40.0000 mg | ORAL_TABLET | Freq: Once | ORAL | Status: AC
  Administered 2019-11-04: 40 mg via ORAL
  Filled 2019-11-04: qty 2

## 2019-11-04 MED ORDER — ACETAMINOPHEN 325 MG PO TABS
650.0000 mg | ORAL_TABLET | Freq: Once | ORAL | Status: AC | PRN
  Administered 2019-11-04: 650 mg via ORAL
  Filled 2019-11-04: qty 2

## 2019-11-04 MED ORDER — PENICILLIN V POTASSIUM 500 MG PO TABS: 500.0000 mg | ORAL_TABLET | Freq: Three times a day (TID) | ORAL | 0 refills | Status: DC

## 2019-11-04 MED ORDER — SODIUM CHLORIDE 0.9 % IV BOLUS
1000.0000 mL | Freq: Once | INTRAVENOUS | Status: AC
  Administered 2019-11-04: 1000 mL via INTRAVENOUS

## 2019-11-04 MED ORDER — OXYCODONE-ACETAMINOPHEN 5-325 MG PO TABS
2.0000 | ORAL_TABLET | Freq: Once | ORAL | Status: AC
  Administered 2019-11-04: 2 via ORAL
  Filled 2019-11-04: qty 2

## 2019-11-04 MED ORDER — IOHEXOL 350 MG/ML SOLN
75.0000 mL | Freq: Once | INTRAVENOUS | Status: AC | PRN
  Administered 2019-11-04: 75 mL via INTRAVENOUS

## 2019-11-04 MED ORDER — IBUPROFEN 800 MG PO TABS
800.0000 mg | ORAL_TABLET | Freq: Once | ORAL | Status: AC
  Administered 2019-11-04: 800 mg via ORAL
  Filled 2019-11-04: qty 1

## 2019-11-04 NOTE — ED Triage Notes (Signed)
Pt sitting in vehicle per wife; having difficulty ambulating due to extreme fatigue.  Had negative Covid and strep, but continues with fevers, body aches, sore throat.  States SaO2 was 91% at home yesterday.   SaO2 at present is 94% while sitting in car.  Pt alert.  Discussed with Colin Mulders, NP --  Pt to go to ED.  Pt and spouse verbalized understanding.

## 2019-11-04 NOTE — ED Triage Notes (Signed)
Pt reports fever, cough, sore throat, and generalized body aches x 11 days.  States he tested negative for strep and COVID 10 days ago.  Took ibuprofen around 2am.

## 2019-11-04 NOTE — Discharge Instructions (Addendum)
These continue to alternate Tylenol and ibuprofen every 4 hours.  Drink plenty of fluids.  Recheck with your doctor next week.  Return to ED if you are worse anytime

## 2019-11-04 NOTE — ED Provider Notes (Addendum)
[facility] EMERGENCY DEPARTMENT Provider Note   CSN: [id number] Arrival date & time: 11/04/19  1111     History Chief Complaint  Patient presents with  . COVID testing  . Sore Throat  . Generalized Body Aches  . Fever    Cameron Steele is a 44 y.o. male.  HPI 44 yo male presents with complaints of sore throat, body aches and fever for 11 days.  He was tested for covid and strep 10 days ago and negative.  He has received a covid vaccine.  He works Equities trader.  He is not a smoker. He has had some decreased appetite but no vomiting or diarrhea.  He denies any immunosuppression of known covid contacts. He feels improved when taking antipyretics but reports fever up to 103 when effects wane.  He denies productive cough or uti symptoms.     Past Medical History:  Diagnosis Date  . High cholesterol   . Hypertension     There are no problems to display for this patient.   History reviewed. No pertinent surgical history.     No family history on file.  Social History   Tobacco Use  . Smoking status: Never Smoker  . Smokeless tobacco: Never Used  Vaping Use  . Vaping Use: Never used  Substance Use Topics  . Alcohol use: No  . Drug use: No    Home Medications Prior to Admission medications   Medication Sig Start Date End Date Taking? Authorizing Provider  fluvoxaMINE (LUVOX) 100 MG tablet Take 1 tablet (100 mg total) by mouth at bedtime. 09/09/17   Eksir, Bo Mcclintock, MD  lithium carbonate (LITHOBID) 300 MG CR tablet Take 4 tablets (1,200 mg total) by mouth at bedtime. 09/09/17   Burnard Leigh, MD  metFORMIN (GLUCOPHAGE) 500 MG tablet Take 1 tablet (500 mg total) by mouth daily with breakfast. 09/09/17 09/09/18  Burnard Leigh, MD  traZODone (DESYREL) 50 MG tablet Take 1 tablet (50 mg total) by mouth at bedtime as needed. 09/09/17   Eksir, Bo Mcclintock, MD    Allergies    Patient has no allergy information on record.  Review of  Systems   Review of Systems  Physical Exam Updated Vital Signs BP 113/63   Pulse 80   Temp (!) 101 F (38.3 C) (Oral)   Resp 20   Wt 108.9 kg   SpO2 94%   BMI 33.47 kg/m   Physical Exam Vitals and nursing note reviewed.  Constitutional:      Appearance: He is well-developed.  HENT:     Head: Normocephalic and atraumatic.     Right Ear: Tympanic membrane normal.     Left Ear: Tympanic membrane normal.     Mouth/Throat:     Mouth: Mucous membranes are moist.     Tonsils: No tonsillar exudate or tonsillar abscesses.     Comments: Erythematous oropharynx without exudate Floor of mouth soft Voice normal Eyes:     Conjunctiva/sclera: Conjunctivae normal.  Cardiovascular:     Rate and Rhythm: Normal rate and regular rhythm.     Heart sounds: Normal heart sounds.  Pulmonary:     Effort: Pulmonary effort is normal.     Breath sounds: Normal breath sounds.  Abdominal:     General: Bowel sounds are normal.     Palpations: Abdomen is soft.  Musculoskeletal:     Cervical back: Normal range of motion and neck supple.  Skin:    General: Skin is warm  and dry.     Capillary Refill: Capillary refill takes less than 2 seconds.  Neurological:     General: No focal deficit present.     Mental Status: He is alert.  Psychiatric:        Mood and Affect: Mood normal.        Behavior: Behavior normal.     ED Results / Procedures / Treatments   Labs (all labs ordered are listed, but only abnormal results are displayed) Labs Reviewed  SARS CORONAVIRUS 2 BY RT PCR (HOSPITAL ORDER, PERFORMED IN [facility])  GROUP A STREP BY PCR  CBC  COMPREHENSIVE METABOLIC PANEL  MONONUCLEOSIS SCREEN    EKG None  Radiology CT Soft Tissue Neck W Contrast  Result Date: 11/04/2019 CLINICAL DATA:  44 year old male with fever cough sore throat and body ache. Negative for strep and COVID-19 10 days ago. EXAM: CT NECK WITH CONTRAST TECHNIQUE: Multidetector CT imaging of the neck was  performed using the standard protocol following the bolus administration of intravenous contrast. CONTRAST:  44mL OMNIPAQUE IOHEXOL 350 MG/ML SOLN COMPARISON:  CTA chest today reported separately today. FINDINGS: Pharynx and larynx: The glottis is closed. Laryngeal soft tissue contours remain within normal limits. Normal epiglottis. Mild enlargement of the lingual tonsil, palatine tonsils and adenoids are less pronounced and within normal limits. No discrete tonsillar hyperenhancement. Negative parapharyngeal and retropharyngeal spaces. Salivary glands: Negative sublingual space, submandibular glands and parotid glands. Thyroid: Negative. Lymph nodes: There is an increased number of subcentimeter lymph nodes in the bilateral level 2 and level 3 stations. Furthermore, the largest level 2 lymph nodes are 10-12 mm short axis. No cystic or necrotic nodes. The other nodal stations are relatively spared. Small nodes may be mildly increased also at the left level 4 station. Vascular: Major vascular structures in the neck and at the skull base appear patent and negative. Limited intracranial: Negative. Visualized orbits: Minimally included. Mastoids and visualized paranasal sinuses: Clear. Skeleton: No acute dental finding. Chronic ununited fracture or ossification center of the C7 through T2 spinous processes. No acute osseous abnormality identified. Upper chest: Reported separately today. IMPRESSION: 1. Reactive appearing bilateral cervical lymphadenopathy: increased number of predominantly subcentimeter nodes at the level 2 and 3 stations. In conjunction with borderline to mild tonsillar enlargement, the appearance is compatible with URI in this setting. 2. No discrete tonsillitis. No fluid or inflammation in the deep soft tissue spaces of the neck. 3.  CTA Chest today reported separately. Electronically Signed   By: Odessa Fleming M.D.   On: 11/04/2019 18:47   CT Angio Chest PE W and/or Wo Contrast  Result Date:  11/04/2019 CLINICAL DATA:  Chest pain shortness of breath pleural effusion EXAM: CT ANGIOGRAPHY CHEST WITH CONTRAST TECHNIQUE: Multidetector CT imaging of the chest was performed using the standard protocol during bolus administration of intravenous contrast. Multiplanar CT image reconstructions and MIPs were obtained to evaluate the vascular anatomy. CONTRAST:  42mL OMNIPAQUE IOHEXOL 350 MG/ML SOLN COMPARISON:  None. FINDINGS: Cardiovascular: There is suboptimal opacification of the main pulmonary artery, however no central pulmonary embolus is seen. Limited visualization of the segmental and subsegmental pulmonary arterial branches. The heart is normal in size. No pericardial effusion or thickening. No evidence right heart strain. There is normal three-vessel brachiocephalic anatomy without proximal stenosis. The thoracic aorta is normal in appearance. Mediastinum/Nodes: No hilar, mediastinal, or axillary adenopathy. Thyroid gland, trachea, and esophagus demonstrate no significant findings. Lungs/Pleura: Streaky atelectasis seen within the right mid lung. Mild  amount of ground-glass opacity at both lung bases. Upper Abdomen: No acute abnormalities present in the visualized portions of the upper abdomen. Musculoskeletal: No chest wall abnormality. No acute or significant osseous findings. Review of the MIP images confirms the above findings. IMPRESSION: Suboptimal opacification of the main pulmonary artery, however no central pulmonary embolism is seen. Limited visualization of the subsegmental and segmental pulmonary arteries. Streaky atelectasis in the right mid lung Electronically Signed   By: Jonna Clark M.D.   On: 11/04/2019 18:44   DG Chest Port 1 View  Result Date: 11/04/2019 CLINICAL DATA:  44 year old male with fever cough and sore throat EXAM: PORTABLE CHEST 1 VIEW COMPARISON:  None. FINDINGS: Cardiomediastinal silhouette within normal limits. Low lung volumes with linear opacities at the lung bases  representing atelectasis. No pneumothorax pleural effusion or confluent airspace disease IMPRESSION: Negative for acute cardiopulmonary disease Electronically Signed   By: Gilmer Mor D.O.   On: 11/04/2019 14:06    Procedures Procedures (including critical care time)  Medications Ordered in ED Medications  acetaminophen (TYLENOL) tablet 650 mg (650 mg Oral Given 11/04/19 1144)  oxyCODONE-acetaminophen (PERCOCET/ROXICET) 5-325 MG per tablet 2 tablet (2 tablets Oral Given 11/04/19 1351)  predniSONE (DELTASONE) tablet 40 mg (40 mg Oral Given 11/04/19 1351)    ED Course  I have reviewed the triage vital signs and the nursing notes.  Pertinent labs & imaging results that were available during my care of the patient were reviewed by me and considered in my medical decision making (see chart for details).    MDM Rules/Calculators/A&P                          44 yo male with fever, sore throat and myalgias.  Covid negative, strep and mono , wbc normal.  Patient with some moderatley low sats-92% and no infiltrate on cxr or other explanation for hypoxia or fevers- plan ct neck and chest to evaluate for infection and pe.  No evidence of PE or definitive infiltrate.  Throat CT reveals no abscesses but reactive adenopathy and inflammation consistent with tonsillitis.  Patient recently completed augmentin and sxs c.w. viral infection.  strict return precautions and close follow-up patient voices understanding of plan. Final Clinical Impression(s) / ED Diagnoses Final diagnoses:  Upper respiratory tract infection, unspecified type    Rx / DC Orders ED Discharge Orders    None       Margarita Grizzle, MD 11/04/19 1924    Margarita Grizzle, MD 11/04/19 1931

## 2019-11-05 ENCOUNTER — Ambulatory Visit (HOSPITAL_COMMUNITY): Payer: Self-pay

## 2019-11-20 DIAGNOSIS — J343 Hypertrophy of nasal turbinates: Secondary | ICD-10-CM | POA: Diagnosis not present

## 2019-11-20 DIAGNOSIS — Z8709 Personal history of other diseases of the respiratory system: Secondary | ICD-10-CM | POA: Diagnosis not present

## 2019-11-20 DIAGNOSIS — J029 Acute pharyngitis, unspecified: Secondary | ICD-10-CM | POA: Insufficient documentation

## 2020-01-15 DIAGNOSIS — F3174 Bipolar disorder, in full remission, most recent episode manic: Secondary | ICD-10-CM | POA: Diagnosis not present

## 2020-08-08 DIAGNOSIS — F3181 Bipolar II disorder: Secondary | ICD-10-CM | POA: Diagnosis not present

## 2020-08-08 DIAGNOSIS — G4700 Insomnia, unspecified: Secondary | ICD-10-CM | POA: Diagnosis not present

## 2020-08-08 DIAGNOSIS — Z5181 Encounter for therapeutic drug level monitoring: Secondary | ICD-10-CM | POA: Diagnosis not present

## 2020-08-08 DIAGNOSIS — F429 Obsessive-compulsive disorder, unspecified: Secondary | ICD-10-CM | POA: Diagnosis not present

## 2020-08-09 DIAGNOSIS — G4700 Insomnia, unspecified: Secondary | ICD-10-CM | POA: Diagnosis not present

## 2020-08-09 DIAGNOSIS — Z5181 Encounter for therapeutic drug level monitoring: Secondary | ICD-10-CM | POA: Diagnosis not present

## 2020-08-09 DIAGNOSIS — F3181 Bipolar II disorder: Secondary | ICD-10-CM | POA: Diagnosis not present

## 2020-08-09 DIAGNOSIS — F429 Obsessive-compulsive disorder, unspecified: Secondary | ICD-10-CM | POA: Diagnosis not present

## 2020-10-22 DIAGNOSIS — E291 Testicular hypofunction: Secondary | ICD-10-CM | POA: Diagnosis not present

## 2020-10-22 DIAGNOSIS — I10 Essential (primary) hypertension: Secondary | ICD-10-CM | POA: Diagnosis not present

## 2020-10-22 DIAGNOSIS — N529 Male erectile dysfunction, unspecified: Secondary | ICD-10-CM | POA: Diagnosis not present

## 2020-10-22 DIAGNOSIS — R7989 Other specified abnormal findings of blood chemistry: Secondary | ICD-10-CM | POA: Diagnosis not present

## 2020-10-22 DIAGNOSIS — E7800 Pure hypercholesterolemia, unspecified: Secondary | ICD-10-CM | POA: Diagnosis not present

## 2020-12-18 DIAGNOSIS — J0410 Acute tracheitis without obstruction: Secondary | ICD-10-CM | POA: Diagnosis not present

## 2020-12-18 DIAGNOSIS — J069 Acute upper respiratory infection, unspecified: Secondary | ICD-10-CM | POA: Diagnosis not present

## 2020-12-25 ENCOUNTER — Other Ambulatory Visit: Payer: Self-pay

## 2020-12-25 ENCOUNTER — Emergency Department (HOSPITAL_COMMUNITY)
Admission: EM | Admit: 2020-12-25 | Discharge: 2020-12-26 | Disposition: A | Discharge status: Home / Self Care | Payer: BLUE CROSS/BLUE SHIELD | Attending: Emergency Medicine | Admitting: Emergency Medicine

## 2020-12-25 ENCOUNTER — Emergency Department (HOSPITAL_COMMUNITY): Payer: BLUE CROSS/BLUE SHIELD

## 2020-12-25 ENCOUNTER — Encounter (HOSPITAL_COMMUNITY): Payer: Self-pay | Admitting: Emergency Medicine

## 2020-12-25 DIAGNOSIS — Z79899 Other long term (current) drug therapy: Secondary | ICD-10-CM | POA: Diagnosis not present

## 2020-12-25 DIAGNOSIS — J069 Acute upper respiratory infection, unspecified: Secondary | ICD-10-CM | POA: Insufficient documentation

## 2020-12-25 DIAGNOSIS — I10 Essential (primary) hypertension: Secondary | ICD-10-CM | POA: Diagnosis not present

## 2020-12-25 DIAGNOSIS — R0789 Other chest pain: Secondary | ICD-10-CM | POA: Diagnosis not present

## 2020-12-25 DIAGNOSIS — R059 Cough, unspecified: Secondary | ICD-10-CM | POA: Diagnosis not present

## 2020-12-25 DIAGNOSIS — R918 Other nonspecific abnormal finding of lung field: Secondary | ICD-10-CM | POA: Diagnosis not present

## 2020-12-25 DIAGNOSIS — R0600 Dyspnea, unspecified: Secondary | ICD-10-CM | POA: Diagnosis not present

## 2020-12-25 DIAGNOSIS — R0602 Shortness of breath: Secondary | ICD-10-CM | POA: Diagnosis not present

## 2020-12-25 LAB — BASIC METABOLIC PANEL
Anion gap: 8 (ref 5–15)
BUN: 14 mg/dL (ref 6–20)
CO2: 26 mmol/L (ref 22–32)
Calcium: 9.2 mg/dL (ref 8.9–10.3)
Chloride: 104 mmol/L (ref 98–111)
Creatinine, Ser: 1.29 mg/dL — ABNORMAL HIGH (ref 0.61–1.24)
GFR, Estimated: >60 mL/min (ref >60)
Glucose, Bld: 91 mg/dL (ref 70–99)
Potassium: 4.2 mmol/L (ref 3.5–5.1)
Sodium: 138 mmol/L (ref 135–145)

## 2020-12-25 LAB — CBC WITH DIFFERENTIAL/PLATELET
Abs Immature Granulocytes: 0.36 10*3/uL — ABNORMAL HIGH (ref 0.00–0.07)
Basophils Absolute: 0.1 10*3/uL (ref 0.0–0.1)
Basophils Relative: 1 %
Eosinophils Absolute: 0.2 10*3/uL (ref 0.0–0.5)
Eosinophils Relative: 2 %
HCT: 50.9 % (ref 39.0–52.0)
Hemoglobin: 16.7 g/dL (ref 13.0–17.0)
Immature Granulocytes: 3 %
Lymphocytes Relative: 20 %
Lymphs Abs: 2.2 10*3/uL (ref 0.7–4.0)
MCH: 28.8 pg (ref 26.0–34.0)
MCHC: 32.8 g/dL (ref 30.0–36.0)
MCV: 87.8 fL (ref 80.0–100.0)
Monocytes Absolute: 0.7 10*3/uL (ref 0.1–1.0)
Monocytes Relative: 7 %
Neutro Abs: 7.3 10*3/uL (ref 1.7–7.7)
Neutrophils Relative %: 67 %
Platelets: 330 10*3/uL (ref 150–400)
RBC: 5.8 MIL/uL (ref 4.22–5.81)
RDW: 12.9 % (ref 11.5–15.5)
WBC: 10.9 10*3/uL — ABNORMAL HIGH (ref 4.0–10.5)
nRBC: 0 % (ref 0.0–0.2)

## 2020-12-25 LAB — TROPONIN I (HIGH SENSITIVITY): Troponin I (High Sensitivity): 6 ng/L (ref <18)

## 2020-12-25 NOTE — ED Provider Notes (Signed)
MSE was initiated and I personally evaluated the patient and placed orders (if any) at  10:13 PM on December 25, 2020.  Patient had the Flu last diagnosed at Urgent Care. Treated, got better, then developed symptoms in the last 2 days of SOB, cough, achy. Went back to Urgent Care who said he had a "white out" lung and needed to come to the ED.   Today's Vitals   12/25/20 2112  BP: (!) 139/92  Pulse: 87  Resp: 20  Temp: 98.9 F (37.2 C)  TempSrc: Oral  SpO2: 94%   There is no height or weight on file to calculate BMI.  Nontoxic in appearance.  Lung sounds to all fields  The patient appears stable so that the remainder of the MSE may be completed by another provider.   Elpidio Anis, PA-C 12/25/20 2215    Linwood Dibbles, MD 12/25/20 2242

## 2020-12-25 NOTE — ED Triage Notes (Signed)
Pt sent here from urgent care for abnormal chest xray (pt seen at [facility]). Pt went to urgent care for shortness of breath and sore throat, pt had flu last week and had been feeling better, but symptoms came back Sunday. UC provider said he "can only see a portion of lung cavity".

## 2020-12-26 DIAGNOSIS — J069 Acute upper respiratory infection, unspecified: Secondary | ICD-10-CM | POA: Diagnosis not present

## 2020-12-26 LAB — TROPONIN I (HIGH SENSITIVITY): Troponin I (High Sensitivity): 3 ng/L (ref <18)

## 2020-12-26 MED ORDER — LIDOCAINE VISCOUS HCL 2 % MT SOLN: 15.0000 mL | OROMUCOSAL | 0 refills | Status: DC | PRN

## 2020-12-26 MED ORDER — DEXAMETHASONE SODIUM PHOSPHATE 10 MG/ML IJ SOLN
10.0000 mg | Freq: Once | INTRAMUSCULAR | Status: AC
  Administered 2020-12-26: 10 mg via INTRAMUSCULAR
  Filled 2020-12-26: qty 1

## 2020-12-26 NOTE — ED Provider Notes (Signed)
[facility] EMERGENCY DEPARTMENT Provider Note   CSN: [id number] Arrival date & time: 12/25/20  1922     History Chief Complaint  Patient presents with   Shortness of Breath    Cameron Steele is a 45 y.o. male.  Patient referred to the emergency department by urgent care.  Patient was diagnosed with influenza last week.  He was also told he had "tracheitis" and was started on prednisone, antibiotics and Tussionex.  He reports he felt better for a few days and then started feeling ill again.  Patient primarily complaining of sore throat currently.  He went to urgent care today to get rechecked and was told that his chest x-ray was grossly abnormal and he needed to go immediately to the emergency department.      Past Medical History:  Diagnosis Date   High cholesterol    Hypertension     There are no problems to display for this patient.   History reviewed. No pertinent surgical history.     History reviewed. No pertinent family history.  Social History   Tobacco Use   Smoking status: Never   Smokeless tobacco: Never  Vaping Use   Vaping Use: Never used  Substance Use Topics   Alcohol use: No   Drug use: No    Home Medications Prior to Admission medications   Medication Sig Start Date End Date Taking? Authorizing Provider  fluvoxaMINE (LUVOX) 100 MG tablet Take 1 tablet (100 mg total) by mouth at bedtime. 09/09/17   Eksir, Bo Mcclintock, MD  lithium carbonate (LITHOBID) 300 MG CR tablet Take 4 tablets (1,200 mg total) by mouth at bedtime. 09/09/17   Burnard Leigh, MD  metFORMIN (GLUCOPHAGE) 500 MG tablet Take 1 tablet (500 mg total) by mouth daily with breakfast. 09/09/17 09/09/18  Burnard Leigh, MD  traZODone (DESYREL) 50 MG tablet Take 1 tablet (50 mg total) by mouth at bedtime as needed. 09/09/17   Eksir, Bo Mcclintock, MD    Allergies    Patient has no known allergies.  Review of Systems   Review of Systems  HENT:  Positive  for sore throat.   Respiratory:  Positive for cough.   All other systems reviewed and are negative.  Physical Exam Updated Vital Signs BP 130/83   Pulse 75   Temp 98.8 F (37.1 C) (Oral)   Resp (!) 24   SpO2 94%   Physical Exam Vitals and nursing note reviewed.  Constitutional:      General: He is not in acute distress.    Appearance: Normal appearance. He is well-developed.  HENT:     Head: Normocephalic and atraumatic.     Right Ear: Hearing normal.     Left Ear: Hearing normal.     Nose: Nose normal.  Eyes:     Conjunctiva/sclera: Conjunctivae normal.     Pupils: Pupils are equal, round, and reactive to light.  Cardiovascular:     Rate and Rhythm: Regular rhythm.     Heart sounds: S1 normal and S2 normal. No murmur heard.   No friction rub. No gallop.  Pulmonary:     Effort: Pulmonary effort is normal. No respiratory distress.     Breath sounds: Normal breath sounds.  Chest:     Chest wall: No tenderness.  Abdominal:     General: Bowel sounds are normal.     Palpations: Abdomen is soft.     Tenderness: There is no abdominal tenderness. There is no guarding or  rebound. Negative signs include Murphy's sign and McBurney's sign.     Hernia: No hernia is present.  Musculoskeletal:        General: Normal range of motion.     Cervical back: Normal range of motion and neck supple.  Skin:    General: Skin is warm and dry.     Findings: No rash.  Neurological:     Mental Status: He is alert and oriented to person, place, and time.     GCS: GCS eye subscore is 4. GCS verbal subscore is 5. GCS motor subscore is 6.     Cranial Nerves: No cranial nerve deficit.     Sensory: No sensory deficit.     Coordination: Coordination normal.  Psychiatric:        Speech: Speech normal.        Behavior: Behavior normal.        Thought Content: Thought content normal.    ED Results / Procedures / Treatments   Labs (all labs ordered are listed, but only abnormal results are  displayed) Labs Reviewed  CBC WITH DIFFERENTIAL/PLATELET - Abnormal; Notable for the following components:      Result Value   WBC 10.9 (*)    Abs Immature Granulocytes 0.36 (*)    All other components within normal limits  BASIC METABOLIC PANEL - Abnormal; Notable for the following components:   Creatinine, Ser 1.29 (*)    All other components within normal limits  TROPONIN I (HIGH SENSITIVITY)  TROPONIN I (HIGH SENSITIVITY)    EKG EKG Interpretation  Date/Time:  Wednesday December 25 2020 22:04:23 EST Ventricular Rate:  90 PR Interval:  156 QRS Duration: 104 QT Interval:  352 QTC Calculation: 430 R Axis:   61 Text Interpretation: Normal sinus rhythm Normal ECG Confirmed by Gilda Crease [phone]) on 12/26/2020 12:32:22 AM  Radiology DG Chest 2 View  Result Date: 12/25/2020 CLINICAL DATA:  Abnormal chest radiograph. EXAM: CHEST - 2 VIEW COMPARISON:  Chest radiograph dated 11/04/2019. FINDINGS: Mild eventration of the right hemidiaphragm. No focal consolidation, pleural effusion, pneumothorax. The cardiac silhouette is within limits. No acute osseous pathology. IMPRESSION: No active cardiopulmonary disease. Electronically Signed   By: Elgie Collard M.D.   On: 12/25/2020 22:39    Procedures Procedures   Medications Ordered in ED Medications - No data to display  ED Course  I have reviewed the triage vital signs and the nursing notes.  Pertinent labs & imaging results that were available during my care of the patient were reviewed by me and considered in my medical decision making (see chart for details).    MDM Rules/Calculators/A&P                           Patient appears well.  He is in no distress.  His lung examination is normal, no wheezing, normal air movement.  Remainder of his vital signs and examination are normal.  Oropharyngeal examination is unremarkable, no swelling, exudate.  An x-ray was performed today.  It is normal.  The only abnormality I  can see is elevation of the right hemidiaphragm, but this is chronic for him.  No pneumothorax, mass, pneumonia.  No evidence of congestive heart failure.  Its not clear what the concern was at urgent care.  I have reviewed that x-ray as well and it looks similar to the x-ray performed here at the emergency department.  No further investigation or treatment is necessary based  on this x-ray.  Final Clinical Impression(s) / ED Diagnoses Final diagnoses:  Upper respiratory tract infection, unspecified type    Rx / DC Orders ED Discharge Orders     None        Gilda Crease, MD 12/26/20 [phone]

## 2020-12-26 NOTE — ED Notes (Addendum)
Pt states that he was treated for inflamation in his throat about a week ago with steroids, antibiotics and cough syrup from PCP.

## 2021-01-01 ENCOUNTER — Ambulatory Visit (INDEPENDENT_AMBULATORY_CARE_PROVIDER_SITE_OTHER): Payer: BLUE CROSS/BLUE SHIELD | Admitting: Internal Medicine

## 2021-01-01 ENCOUNTER — Encounter: Payer: Self-pay | Admitting: Internal Medicine

## 2021-01-01 ENCOUNTER — Other Ambulatory Visit: Payer: Self-pay

## 2021-01-01 VITALS — BP 118/72 | HR 86 | Ht 71.0 in | Wt 255.0 lb

## 2021-01-01 DIAGNOSIS — R1011 Right upper quadrant pain: Secondary | ICD-10-CM | POA: Diagnosis not present

## 2021-01-01 DIAGNOSIS — R0602 Shortness of breath: Secondary | ICD-10-CM | POA: Diagnosis not present

## 2021-01-01 DIAGNOSIS — R053 Chronic cough: Secondary | ICD-10-CM

## 2021-01-01 NOTE — Patient Instructions (Addendum)
Please schedule follow up scheduled with myself in  1 month.   Before your next visit I would like you to have: Full set of PFTs - 1 hour  I have ordered an ultrasound of your gall bladder - will contact you with results, and also when it is scheduled for you.

## 2021-01-01 NOTE — Progress Notes (Signed)
Cameron Steele    [id number]    Jan 14, 1976  Primary Care Physician:Hagler, Fleet Contras, MD  Referring Physician: Aliene Beams, MD [id number] [place],  [place] [address] Reason for Consultation: shortness of breath, abnormal chest xray Date of Consultation: 01/01/2021  Chief complaint:   Chief Complaint  Patient presents with   Consult    Referred by PA, SOB during exertion and rest     HPI:  Last week went to urgent care and had a chest xray and it showed diagraphmatic eventration. He is here to discuss this.   Also notes as he has gotten older it takes him longer to get over a URI.  He has chronic cough for the past couple of years. Worse over time. Worse in the morning, wakes himself and people up in his sleep.   Had a similar episode last year which resulted in an ED visit. Needs abx for a URI at least once a year.   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   He has associated shortness of breath.   He notes exertional dyspnea and coughing with playing sports in high school. He describes himself as very active.  Was hospitalized for pneumonia when he was 5.  Does not take any breathing treatments or inhalers.  Has been prescribed albuterol before He isn't sure if prednisone helps or not.    He has constant pain in his right side which is dull and feels like a stitch in his side. Chronic. Not worse with inspiration. Also having nausea with it. Worse with food. Also having heart burn over the past couple of years. He is on nexium.    Social history:  Occupation: Film/video editor  Exposures:  lives at home with 3 kids, cats and dogs.  Smoking history: never smoker, passive smoke exposure in childhood.   Social History   Occupational History   Not on file  Tobacco Use   Smoking status: Never   Smokeless tobacco: Never  Vaping Use   Vaping Use: Never used  Substance and Sexual Activity    Alcohol use: No   Drug use: No   Sexual activity: Not on file    Relevant family history:  Family History  Problem Relation Age of Onset   Asthma Father     Past Medical History:  Diagnosis Date   High cholesterol    Hypertension     Past Surgical History:  Procedure Laterality Date   LYMPH NODE BIOPSY     inguinal, benign     Physical Exam: Blood pressure 118/72, pulse 86, height 5\' 11"  (1.803 m), weight 255 lb (115.7 kg), SpO2 96 %. Gen:      No acute distress ENT:  mild erythema, no nasal polyps, mucus membranes moist Lungs:    No increased respiratory effort, symmetric chest wall excursion, clear to auscultation bilaterally, no wheezes or crackles CV:         Regular rate and rhythm; no murmurs, rubs, or gallops.  No pedal edema Abd:      + bowel sounds; soft, non-tender; no distension, obese, no RUQ ttp MSK: no acute synovitis of DIP or PIP joints, no mechanics hands.  Skin:      Warm and dry; no rashes Neuro: normal speech, no focal facial asymmetry Psych: alert and oriented x3, normal mood and affect   Data Reviewed/Medical Decision Making:  Independent interpretation of tests: Imaging:  Review of patient's Chest xray Nov 2022 images revealed no acute process, chronic elevation of right hemidiaphragm. The patient's images have been independently reviewed by me.    PFTs: None on file No flowsheet data found.  Labs:  Lab Results  Component Value Date   WBC 10.9 (H) 12/25/2020   HGB 16.7 12/25/2020   HCT 50.9 12/25/2020   MCV 87.8 12/25/2020   PLT 330 12/25/2020   Lab Results  Component Value Date   NA 138 12/25/2020   K 4.2 12/25/2020   CL 104 12/25/2020   CO2 26 12/25/2020     Immunization status:  Immunization History  Administered Date(s) Administered   Janssen (J&J) SARS-COV-2 Vaccination 07/27/2019   Td 08/08/1990   Tdap 08/10/2019     I reviewed prior external note(s) from PCP, ED  I reviewed the result(s) of the labs and imaging  as noted above.   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  RUQ pain concerning for biliary colic  Plan/Recommendations: Will obtain RUQ ultrasound - if abnormal will get in touch with Dr. Tracie Harrier in case he needs referral to GI or surgery.   Chronic cough - differential includes reflux, asthma.  Will obtain full set of PFTs.  Will hold off on empiric inhaler therapy since he doesn't perceive benefit from steroids and inhalers.   Discussed right elevation hemi-diaphragm. Likely chronic and post-viral, will use PFTs to check for restriction, likely this is just an incidental finding for him. Discussed indications for plication surgery as well.   We discussed disease management and progression at length today.    Return to Care: Return in about 4 weeks (around 01/29/2021).  Durel Salts, MD Pulmonary and Critical Care Medicine [facility] Office:[phone]  CC: Aliene Beams, MD

## 2021-01-08 ENCOUNTER — Other Ambulatory Visit: Payer: Self-pay

## 2021-01-08 ENCOUNTER — Ambulatory Visit (HOSPITAL_COMMUNITY)
Admission: RE | Admit: 2021-01-08 | Discharge: 2021-01-08 | Disposition: A | Discharge status: Home / Self Care | Payer: BC Managed Care – PPO | Source: Ambulatory Visit | Attending: Internal Medicine | Admitting: Internal Medicine

## 2021-01-08 DIAGNOSIS — K8020 Calculus of gallbladder without cholecystitis without obstruction: Secondary | ICD-10-CM | POA: Diagnosis not present

## 2021-01-08 DIAGNOSIS — K760 Fatty (change of) liver, not elsewhere classified: Secondary | ICD-10-CM | POA: Diagnosis not present

## 2021-01-08 DIAGNOSIS — R1011 Right upper quadrant pain: Secondary | ICD-10-CM

## 2021-02-05 ENCOUNTER — Other Ambulatory Visit: Payer: Self-pay

## 2021-02-05 ENCOUNTER — Ambulatory Visit (INDEPENDENT_AMBULATORY_CARE_PROVIDER_SITE_OTHER): Payer: BC Managed Care – PPO | Admitting: Internal Medicine

## 2021-02-05 DIAGNOSIS — R0602 Shortness of breath: Secondary | ICD-10-CM

## 2021-02-05 NOTE — Progress Notes (Signed)
PFT done today. 

## 2021-02-21 ENCOUNTER — Ambulatory Visit (INDEPENDENT_AMBULATORY_CARE_PROVIDER_SITE_OTHER): Payer: BC Managed Care – PPO | Admitting: Internal Medicine

## 2021-02-21 ENCOUNTER — Other Ambulatory Visit: Payer: Self-pay

## 2021-02-21 ENCOUNTER — Encounter: Payer: Self-pay | Admitting: Internal Medicine

## 2021-02-21 VITALS — BP 98/60 | HR 90 | Temp 98.1°F | Ht 71.0 in | Wt 239.2 lb

## 2021-02-21 DIAGNOSIS — J4520 Mild intermittent asthma, uncomplicated: Secondary | ICD-10-CM | POA: Diagnosis not present

## 2021-02-21 MED ORDER — ALBUTEROL SULFATE HFA 108 (90 BASE) MCG/ACT IN AERS: 2.0000 | INHALATION_SPRAY | Freq: Four times a day (QID) | RESPIRATORY_TRACT | 5 refills | Status: DC | PRN

## 2021-02-21 MED ORDER — ADVAIR HFA 115-21 MCG/ACT IN AERO: 2.0000 | INHALATION_SPRAY | Freq: Two times a day (BID) | RESPIRATORY_TRACT | 12 refills | Status: DC

## 2021-02-21 NOTE — Patient Instructions (Addendum)
Please schedule follow up scheduled with myself in 4 months.  If my schedule is not open yet, we will contact you with a reminder closer to that time. Please call [phone] if you haven't heard from [place] a month before.    Start advair 2 puffs as needed when you get shortness of breath. Don't take this more than twice a day. Gargle after use.   Take the albuterol rescue inhaler every 4 to 6 hours as needed for wheezing or shortness of breath. You can also take it 15 minutes before exercise or exertional activity. Side effects include heart racing or pounding, jitters or anxiety. If you have a history of an irregular heart rhythm, it can make this worse. Can also give some patients a hard time sleeping.  To inhale the aerosol using an inhaler, follow these steps:  Remove the protective dust cap from the end of the mouthpiece. If the dust cap was not placed on the mouthpiece, check the mouthpiece for dirt or other objects. Be sure that the canister is fully and firmly inserted in the mouthpiece. 2. If you are using the inhaler for the first time or if you have not used the inhaler in more than 14 days, you will need to prime it. You may also need to prime the inhaler if it has been dropped. Ask your pharmacist or check the manufacturer's information if this happens. To prime the inhaler, shake it well and then press down on the canister 4 times to release 4 sprays into the air, away from your face. Be careful not to get albuterol in your eyes. 3. Shake the inhaler well. 4. Breathe out as completely as possible through your mouth. 4. Hold the canister with the mouthpiece on the bottom, facing you and the canister pointing upward. Place the open end of the mouthpiece into your mouth. Close your lips tightly around the mouthpiece. 6. Breathe in slowly and deeply through the mouthpiece.At the same time, press down once on the container to spray the medication into your mouth. 7. Try to hold your breath  for 10 seconds. remove the inhaler, and breathe out slowly. 8. If you were told to use 2 puffs, wait 1 minute and then repeat steps 3-7. 9. Replace the protective cap on the inhaler. 10. Clean your inhaler regularly. Follow the manufacturer's directions carefully and ask your doctor or pharmacist if you have any questions about cleaning your inhaler.  Check the back of the inhaler to keep track of the total number of doses left on the inhaler.

## 2021-02-21 NOTE — Progress Notes (Signed)
The patient has been prescribed the inhaler advair. Inhaler technique was demonstrated to patient. The patient subsequently demonstrated correct technique.  

## 2021-02-21 NOTE — Progress Notes (Signed)
Cameron Steele    HW:[id number]    November 11, 1975  Primary Care Physician:Hagler, Apolonio Schneiders, MD Date of Appointment: 02/21/2021 Established Patient Visit  Chief complaint:   Chief Complaint  Patient presents with   Follow-up    F/u after PFT.  No concerns.     HPI: Cameron Steele is a 46 y.o. man here with shortness of breath and elevation right hemidiaphragm.   Interval Updates: Here for follow up after PFTs. He is feeling well. On most days can go to the gym and work out. Symptoms of chest tightness and difficulty breathing have sudden onset and resolve with time. He has not perceived albuterol inhaler benefit in the past. Also notes that when he gets URI, it takes him much longer to recover.   Wife notes he has taken child's nebulizer treatment in the past and it has helped.   I have reviewed the patient's family social and past medical history and updated as appropriate.   Past Medical History:  Diagnosis Date   High cholesterol    Hypertension     Past Surgical History:  Procedure Laterality Date   LYMPH NODE BIOPSY     inguinal, benign    Family History  Problem Relation Age of Onset   Asthma Father     Social History   Occupational History   Not on file  Tobacco Use   Smoking status: Never   Smokeless tobacco: Never  Vaping Use   Vaping Use: Never used  Substance and Sexual Activity   Alcohol use: No   Drug use: No   Sexual activity: Not on file     Physical Exam: Blood pressure 98/60, pulse 90, temperature 98.1 F (36.7 C), temperature source Oral, height 5\' 11"  (1.803 m), weight 239 lb 3.2 oz (108.5 kg), SpO2 96 %.  Gen:      No acute distress ENT:  no nasal polyps, mucus membranes moist Lungs:    No increased respiratory effort, symmetric chest wall excursion, clear to auscultation bilaterally, no wheezes or crackles CV:         Regular rate and rhythm; no murmurs, rubs, or gallops.  No pedal edema   Data Reviewed: Imaging: I have personally  reviewed the Nov 2022 chest xray with mild eventration of right hemidiaphragm   RUQ u/s shows hepatic steatosis, no cholelithiasis.   PFTs:  PFT Results Latest Ref Rng & Units 02/05/2021  FVC-Predicted Pre % 99  FVC-Post L 5.74  FVC-Predicted Post % 109  Pre FEV1/FVC % % 85  Post FEV1/FCV % % 89  FEV1-Pre L 4.47  FEV1-Predicted Pre % 107  FEV1-Post L 5.10  DLCO uncorrected ml/min/mmHg 37.59  DLCO UNC% % 122  DLCO corrected ml/min/mmHg 35.64  DLCO COR %Predicted % 116  DLVA Predicted % 111  TLC L 7.57  TLC % Predicted % 107  RV % Predicted % 96   I have personally reviewed the patient's PFTs and normal pulmonary function. There is a significant response to bronchodilator administration. DLCO is elevated.   Labs: Lab Results  Component Value Date   WBC 10.9 (H) 12/25/2020   HGB 16.7 12/25/2020   HCT 50.9 12/25/2020   MCV 87.8 12/25/2020   PLT 330 12/25/2020     Immunization status: Immunization History  Administered Date(s) Administered   Janssen (J&J) SARS-COV-2 Vaccination 07/27/2019, 02/23/2020   Td 08/08/1990   Tdap 08/10/2019     Assessment:  Shortness of breath Right hemidiaphragm eventration Mild  intermittent asthma  Plan/Recommendations: Suspect mild intermittent asthma based on symptoms, pfts, family history of asthma. Trial ICS-LABA prn. He will keep track of his symptoms. Will give him an albuterol mdi as well given significant BD response.    Return to Care: Return in about 4 months (around 06/21/2021).   Lenice Llamas, MD Pulmonary and [place]

## 2021-02-24 LAB — PULMONARY FUNCTION TEST
DL/VA % pred: 111 %
DL/VA: 5.03 ml/min/mmHg/L
DLCO cor % pred: 116 %
DLCO cor: 35.64 ml/min/mmHg
DLCO unc % pred: 122 %
DLCO unc: 37.59 ml/min/mmHg
FEF 25-75 Post: 7.5 L/sec
FEF 25-75 Pre: 4.91 L/sec
FEF2575-%Change-Post: 52 %
FEF2575-%Pred-Post: 198 %
FEF2575-%Pred-Pre: 130 %
FEV1-%Change-Post: 14 %
FEV1-%Pred-Post: 123 %
FEV1-%Pred-Pre: 107 %
FEV1-Post: 5.1 L
FEV1-Pre: 4.47 L
FEV1FVC-%Change-Post: 4 %
FEV1FVC-%Pred-Pre: 107 %
FEV6-%Change-Post: 7 %
FEV6-%Pred-Post: 110 %
FEV6-%Pred-Pre: 102 %
FEV6-Post: 5.66 L
FEV6-Pre: 5.25 L
FEV6FVC-%Change-Post: 0 %
FEV6FVC-%Pred-Post: 103 %
FEV6FVC-%Pred-Pre: 102 %
FVC-%Change-Post: 9 %
FVC-%Pred-Post: 109 %
FVC-%Pred-Pre: 99 %
FVC-Post: 5.74 L
Post FEV1/FVC ratio: 89 %
Post FEV6/FVC ratio: 100 %
Pre FEV1/FVC ratio: 85 %
Pre FEV6/FVC Ratio: 100 %
RV % pred: 96 %
RV: 1.89 L
TLC % pred: 107 %
TLC: 7.57 L

## 2021-04-29 DIAGNOSIS — Z113 Encounter for screening for infections with a predominantly sexual mode of transmission: Secondary | ICD-10-CM | POA: Diagnosis not present

## 2021-04-29 DIAGNOSIS — E7800 Pure hypercholesterolemia, unspecified: Secondary | ICD-10-CM | POA: Diagnosis not present

## 2021-04-29 DIAGNOSIS — I10 Essential (primary) hypertension: Secondary | ICD-10-CM | POA: Diagnosis not present

## 2021-04-29 DIAGNOSIS — R946 Abnormal results of thyroid function studies: Secondary | ICD-10-CM | POA: Diagnosis not present

## 2021-04-29 DIAGNOSIS — N529 Male erectile dysfunction, unspecified: Secondary | ICD-10-CM | POA: Diagnosis not present

## 2021-04-29 DIAGNOSIS — Z0000 Encounter for general adult medical examination without abnormal findings: Secondary | ICD-10-CM | POA: Diagnosis not present

## 2021-07-16 ENCOUNTER — Ambulatory Visit: Payer: BC Managed Care – PPO | Admitting: Podiatry

## 2021-07-16 ENCOUNTER — Other Ambulatory Visit: Payer: Self-pay | Admitting: Podiatry

## 2021-07-16 ENCOUNTER — Ambulatory Visit (INDEPENDENT_AMBULATORY_CARE_PROVIDER_SITE_OTHER): Payer: BC Managed Care – PPO

## 2021-07-16 DIAGNOSIS — M7751 Other enthesopathy of right foot: Secondary | ICD-10-CM | POA: Diagnosis not present

## 2021-07-16 DIAGNOSIS — M779 Enthesopathy, unspecified: Secondary | ICD-10-CM | POA: Diagnosis not present

## 2021-07-16 DIAGNOSIS — M79671 Pain in right foot: Secondary | ICD-10-CM

## 2021-07-22 DIAGNOSIS — R948 Abnormal results of function studies of other organs and systems: Secondary | ICD-10-CM | POA: Diagnosis not present

## 2021-07-22 DIAGNOSIS — E349 Endocrine disorder, unspecified: Secondary | ICD-10-CM | POA: Diagnosis not present

## 2021-07-22 DIAGNOSIS — N5201 Erectile dysfunction due to arterial insufficiency: Secondary | ICD-10-CM | POA: Diagnosis not present

## 2021-07-22 NOTE — Progress Notes (Signed)
Subjective:  Patient ID: Cameron Steele, male    DOB: [birth year],  MRN: [id number]  No chief complaint on file.   46 y.o. male presents with the above complaint.  Patient presents with complaint of right hallux interphalangeal joint pain.  Patient has been going for 1 month is progressive gotten worse there is limited range of motion.  Hurts with ambulation.  He wanted to get it evaluated.  He denies any other acute complaints.  He states it hurts with ambulation.  Pain scale is 6 out of 10.  It is little bit better today.  He wanted to get it evaluated   Review of Systems: Negative except as noted in the HPI. Denies N/V/F/Ch.  Past Medical History:  Diagnosis Date   High cholesterol    Hypertension     Current Outpatient Medications:    albuterol (VENTOLIN HFA) 108 (90 Base) MCG/ACT inhaler, Inhale 2 puffs into the lungs every 6 (six) hours as needed for wheezing or shortness of breath., Disp: 18 g, Rfl: 5   atorvastatin (LIPITOR) 40 MG tablet, 40 mg., Disp: , Rfl:    esomeprazole (NEXIUM) 40 MG capsule, Take 40 mg by mouth., Disp: , Rfl:    fluticasone-salmeterol (ADVAIR HFA) 115-21 MCG/ACT inhaler, Inhale 2 puffs into the lungs 2 (two) times daily., Disp: 1 each, Rfl: 12   fluvoxaMINE (LUVOX) 100 MG tablet, Take 1 tablet (100 mg total) by mouth at bedtime., Disp: 90 tablet, Rfl: 1   lithium carbonate (LITHOBID) 300 MG CR tablet, Take 4 tablets (1,200 mg total) by mouth at bedtime., Disp: 360 tablet, Rfl: 1   losartan (COZAAR) 50 MG tablet, 50 mg., Disp: , Rfl:    metFORMIN (GLUCOPHAGE) 500 MG tablet, Take 1 tablet (500 mg total) by mouth daily with breakfast., Disp: 90 tablet, Rfl: 1   traZODone (DESYREL) 50 MG tablet, Take 1 tablet (50 mg total) by mouth at bedtime as needed., Disp: 90 tablet, Rfl: 1  Social History   Tobacco Use  Smoking Status Never  Smokeless Tobacco Never    No Known Allergies Objective:  There were no vitals filed for this visit. There is no height or  weight on file to calculate BMI. Constitutional Well developed. Well nourished.  Vascular Dorsalis pedis pulses palpable bilaterally. Posterior tibial pulses palpable bilaterally. Capillary refill normal to all digits.  No cyanosis or clubbing noted. Pedal hair growth normal.  Neurologic Normal speech. Oriented to person, place, and time. Epicritic sensation to light touch grossly present bilaterally.  Dermatologic Nails well groomed and normal in appearance. No open wounds. No skin lesions.  Orthopedic: Pain on palpation of right hallux IPJ joint pain with range of motion of the joint.  No crepitus or deep intra-articular pain noted.  Pain on palpation to the joint.  No extensor or flexor tendinitis noted.  No pain at the metatarsophalangeal joint   Radiographs: 3 views of skeletally mature the right foot: No osteoarthritic changes noted.  Small uneven joint space narrowing noted on the medial side.  No fractures noted.).  Noted. Assessment:   1. Capsulitis of toe, right    Plan:  Patient was evaluated and treated and all questions answered.  Right hallux IPJ capsulitis -I explained the patient the etiology of capsulitis and worse treatment options were discussed.  Given the amount of pain that he is having I believe would benefit from steroid injection help decrease acute inflammatory component associate with pain.  Patient agrees with plan like to proceed with steroid injection -  A steroid injection was performed at right IPJ hallux using 1% plain Lidocaine and 10 mg of Kenalog. This was well tolerated.   No follow-ups on file.

## 2021-12-02 ENCOUNTER — Ambulatory Visit: Payer: BC Managed Care – PPO | Admitting: Podiatry

## 2021-12-02 DIAGNOSIS — M7751 Other enthesopathy of right foot: Secondary | ICD-10-CM | POA: Diagnosis not present

## 2021-12-02 NOTE — Progress Notes (Signed)
Subjective:  Patient ID: Cameron Steele, male    DOB: [birth year],  MRN: [id number]  Chief Complaint  Patient presents with   Toe Pain    Pt stated that his toe pain is not much better     46 y.o. male presents with the above complaint.  Patient presents with right hallux interphalangeal joint pain.  Patient states that the injection helped some but still not much better.  He would like to discuss next treatment plan.  He denies any other acute complaints.   Review of Systems: Negative except as noted in the HPI. Denies N/V/F/Ch.  Past Medical History:  Diagnosis Date   High cholesterol    Hypertension     Current Outpatient Medications:    albuterol (VENTOLIN HFA) 108 (90 Base) MCG/ACT inhaler, Inhale 2 puffs into the lungs every 6 (six) hours as needed for wheezing or shortness of breath., Disp: 18 g, Rfl: 5   atorvastatin (LIPITOR) 40 MG tablet, 40 mg., Disp: , Rfl:    esomeprazole (NEXIUM) 40 MG capsule, Take 40 mg by mouth., Disp: , Rfl:    fluticasone-salmeterol (ADVAIR HFA) 115-21 MCG/ACT inhaler, Inhale 2 puffs into the lungs 2 (two) times daily., Disp: 1 each, Rfl: 12   fluvoxaMINE (LUVOX) 100 MG tablet, Take 1 tablet (100 mg total) by mouth at bedtime., Disp: 90 tablet, Rfl: 1   lithium carbonate (LITHOBID) 300 MG CR tablet, Take 4 tablets (1,200 mg total) by mouth at bedtime., Disp: 360 tablet, Rfl: 1   losartan (COZAAR) 50 MG tablet, 50 mg., Disp: , Rfl:    metFORMIN (GLUCOPHAGE) 500 MG tablet, Take 1 tablet (500 mg total) by mouth daily with breakfast., Disp: 90 tablet, Rfl: 1   traZODone (DESYREL) 50 MG tablet, Take 1 tablet (50 mg total) by mouth at bedtime as needed., Disp: 90 tablet, Rfl: 1  Social History   Tobacco Use  Smoking Status Never  Smokeless Tobacco Never    No Known Allergies Objective:  There were no vitals filed for this visit. There is no height or weight on file to calculate BMI. Constitutional Well developed. Well nourished.  Vascular Dorsalis  pedis pulses palpable bilaterally. Posterior tibial pulses palpable bilaterally. Capillary refill normal to all digits.  No cyanosis or clubbing noted. Pedal hair growth normal.  Neurologic Normal speech. Oriented to person, place, and time. Epicritic sensation to light touch grossly present bilaterally.  Dermatologic Nails well groomed and normal in appearance. No open wounds. No skin lesions.  Orthopedic: Pain on palpation of right hallux IPJ joint pain with range of motion of the joint.  No crepitus or deep intra-articular pain noted.  Pain on palpation to the joint.  No extensor or flexor tendinitis noted.  No pain at the metatarsophalangeal joint   Radiographs: 3 views of skeletally mature the right foot: No osteoarthritic changes noted.  Small uneven joint space narrowing noted on the medial side.  No fractures noted.).  Noted. Assessment:   1. Capsulitis of toe, right     Plan:  Patient was evaluated and treated and all questions answered.  Right hallux IPJ capsulitis -I explained the patient the etiology of capsulitis and worse treatment options were discussed.   -Clinically at this time he has failed steroid injection he would benefit from a cam boot immobilization.  I also believe he will benefit from an MRI evaluation of the toe given the amount of pain that he is having. -Cam boot was dispensed -MRI was ordered to rule out arthritis/capsulitis/flexor  tendinitis   No follow-ups on file.

## 2021-12-12 ENCOUNTER — Ambulatory Visit
Admission: RE | Admit: 2021-12-12 | Discharge: 2021-12-12 | Disposition: A | Discharge status: Home / Self Care | Payer: BC Managed Care – PPO | Source: Ambulatory Visit | Attending: Podiatry | Admitting: Podiatry

## 2021-12-12 DIAGNOSIS — M7989 Other specified soft tissue disorders: Secondary | ICD-10-CM | POA: Diagnosis not present

## 2021-12-12 DIAGNOSIS — M7751 Other enthesopathy of right foot: Secondary | ICD-10-CM

## 2021-12-17 ENCOUNTER — Telehealth: Payer: Self-pay | Admitting: Podiatry

## 2021-12-17 NOTE — Telephone Encounter (Signed)
Pts wife left message yesterday at 503pm stating pt had mri of toe and results are in [place] and they were not sure if they would get a call to discuss or if they needed an appt. Please advise?

## 2021-12-17 NOTE — Telephone Encounter (Signed)
Pts wife called back stating the pt had talked with the doctor and he said he would have to have surgery. I have scheduled them for a surgical consult on 11.9 in [place] office. I did offer them tomorrow or next week in [place] but pt had meetings those days.

## 2021-12-25 ENCOUNTER — Encounter: Payer: Self-pay | Admitting: Podiatry

## 2021-12-25 ENCOUNTER — Ambulatory Visit: Payer: BC Managed Care – PPO | Admitting: Podiatry

## 2021-12-25 DIAGNOSIS — M7751 Other enthesopathy of right foot: Secondary | ICD-10-CM | POA: Diagnosis not present

## 2021-12-25 DIAGNOSIS — Z01818 Encounter for other preprocedural examination: Secondary | ICD-10-CM | POA: Diagnosis not present

## 2021-12-25 DIAGNOSIS — M19071 Primary osteoarthritis, right ankle and foot: Secondary | ICD-10-CM

## 2021-12-25 NOTE — Progress Notes (Signed)
Subjective:  Patient ID: Cameron Steele, male    DOB: 1975-04-19,  MRN: [id number]  Chief Complaint  Patient presents with   Foot Pain    Right foot pain Pt stated that the pain comes and goes     46 y.o. male presents with the above complaint.  Patient presents with right hallux interphalangeal joint pain.  Patient states he has failed all conservative care would like to discuss surgical options at this time.  Review of Systems: Negative except as noted in the HPI. Denies N/V/F/Ch.  Past Medical History:  Diagnosis Date   High cholesterol    Hypertension     Current Outpatient Medications:    albuterol (VENTOLIN HFA) 108 (90 Base) MCG/ACT inhaler, Inhale 2 puffs into the lungs every 6 (six) hours as needed for wheezing or shortness of breath., Disp: 18 g, Rfl: 5   atorvastatin (LIPITOR) 40 MG tablet, 40 mg., Disp: , Rfl:    esomeprazole (NEXIUM) 40 MG capsule, Take 40 mg by mouth., Disp: , Rfl:    fluticasone-salmeterol (ADVAIR HFA) 115-21 MCG/ACT inhaler, Inhale 2 puffs into the lungs 2 (two) times daily., Disp: 1 each, Rfl: 12   fluvoxaMINE (LUVOX) 100 MG tablet, Take 1 tablet (100 mg total) by mouth at bedtime., Disp: 90 tablet, Rfl: 1   lithium carbonate (LITHOBID) 300 MG CR tablet, Take 4 tablets (1,200 mg total) by mouth at bedtime., Disp: 360 tablet, Rfl: 1   losartan (COZAAR) 50 MG tablet, 50 mg., Disp: , Rfl:    metFORMIN (GLUCOPHAGE) 500 MG tablet, Take 1 tablet (500 mg total) by mouth daily with breakfast., Disp: 90 tablet, Rfl: 1   traZODone (DESYREL) 50 MG tablet, Take 1 tablet (50 mg total) by mouth at bedtime as needed., Disp: 90 tablet, Rfl: 1  Social History   Tobacco Use  Smoking Status Never  Smokeless Tobacco Never    No Known Allergies Objective:  There were no vitals filed for this visit. There is no height or weight on file to calculate BMI. Constitutional Well developed. Well nourished.  Vascular Dorsalis pedis pulses palpable bilaterally. Posterior  tibial pulses palpable bilaterally. Capillary refill normal to all digits.  No cyanosis or clubbing noted. Pedal hair growth normal.  Neurologic Normal speech. Oriented to person, place, and time. Epicritic sensation to light touch grossly present bilaterally.  Dermatologic Nails well groomed and normal in appearance. No open wounds. No skin lesions.  Orthopedic: Pain on palpation of right hallux IPJ joint pain with range of motion of the joint.  No crepitus or deep intra-articular pain noted.  Pain on palpation to the joint.  No extensor or flexor tendinitis noted.  No pain at the metatarsophalangeal joint   Radiographs: 3 views of skeletally mature the right foot: No osteoarthritic changes noted.  Small uneven joint space narrowing noted on the medial side.  No fractures noted.).  Noted. Assessment:   1. Capsulitis of toe, right   2. Degenerative arthritis of toe joint, right   3. Encounter for preoperative examination for general surgical procedure    1. Interphalangeal joint degenerative changes involving the great toe more pronounced laterally. No definite erosive findings. 2. Mild degenerative changes at the first MTP joint. 3. Small focus of artifact involving the plantar skin surface of the great toe at the level of the MTP joint. This could be a tiny metallic fragment/foreign body possibly evident on the radiographs.  Plan:  Patient was evaluated and treated and all questions answered.  Right hallux IPJ  capsulitis with underlying arthritis -I explained the patient the etiology of capsulitis and worse treatment options were discussed.   -Given the amount of pain that he is having in the setting of failed steroid shots and immobilization I believe patient will benefit from surgical fusion of hallux IPJ.  I discussed my preoperative intra postoperative plan in extensive detail patient agrees with plan like to proceed with surgical fusion of the hallux IPJ right side -Informed  surgical risk consent was reviewed and read aloud to the patient.  I reviewed the films.  I have discussed my findings with the patient in great detail.  I have discussed all risks including but not limited to infection, stiffness, scarring, limp, disability, deformity, damage to blood vessels and nerves, numbness, poor healing, need for braces, arthritis, chronic pain, amputation, death.  All benefits and realistic expectations discussed in great detail.  I have made no promises as to the outcome.  I have provided realistic expectations.  I have offered the patient a 2nd opinion, which they have declined and assured me they preferred to proceed despite the risks    No follow-ups on file.

## 2021-12-26 ENCOUNTER — Encounter: Payer: Self-pay | Admitting: Podiatry

## 2021-12-26 ENCOUNTER — Other Ambulatory Visit: Payer: Self-pay | Admitting: Podiatry

## 2021-12-26 MED ORDER — OXYCODONE-ACETAMINOPHEN 5-325 MG PO TABS
1.0000 | ORAL_TABLET | ORAL | 0 refills | Status: DC | PRN
Start: 2021-12-26 — End: 2022-11-30

## 2021-12-29 IMAGING — CT CT ANGIO CHEST
2 of 6 series · 19 of 36 positions shown · IV contrast (omnipaque)
Comparison: None.

CLINICAL DATA: Chest pain shortness of breath pleural effusion

EXAM:
CT ANGIOGRAPHY CHEST WITH CONTRAST
TECHNIQUE: Multidetector CT imaging of the chest was performed using the
standard protocol during bolus administration of intravenous
contrast. Multiplanar CT image reconstructions and MIPs were
obtained to evaluate the vascular anatomy.
CONTRAST:  75mL OMNIPAQUE IOHEXOL 350 MG/ML SOLN

[Series 8: pe thins · axial · 0.79mm/px · z∈[-510,-235]mm · 18 of 437 slices shown]
[im 22/437  lung]
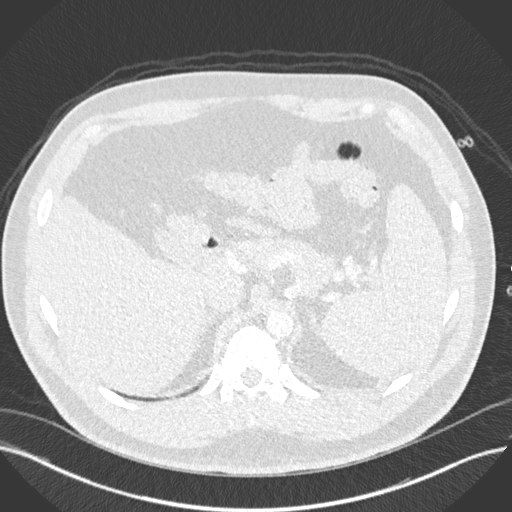
[im 44/437  mediastinal]
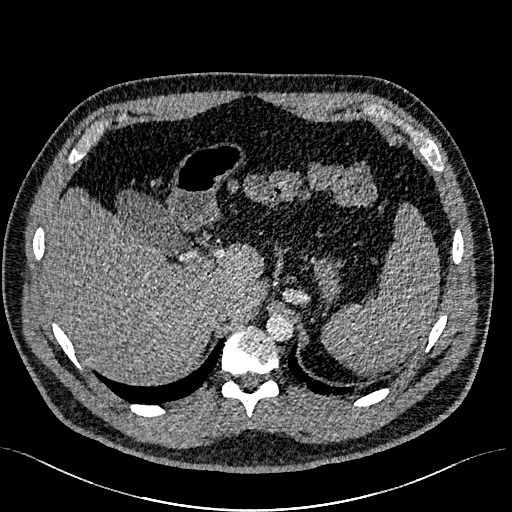
[im 66/437  lung]
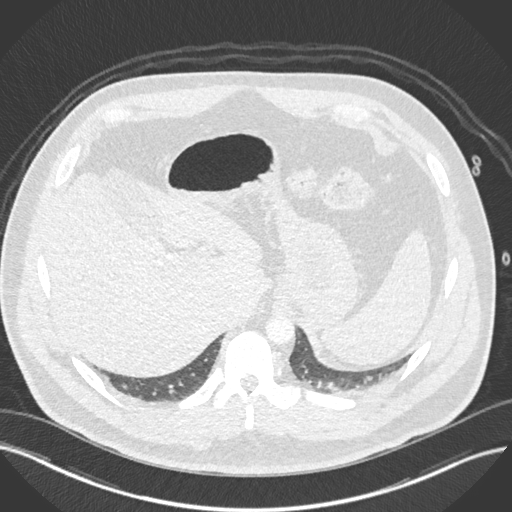
[im 88/437  mediastinal]
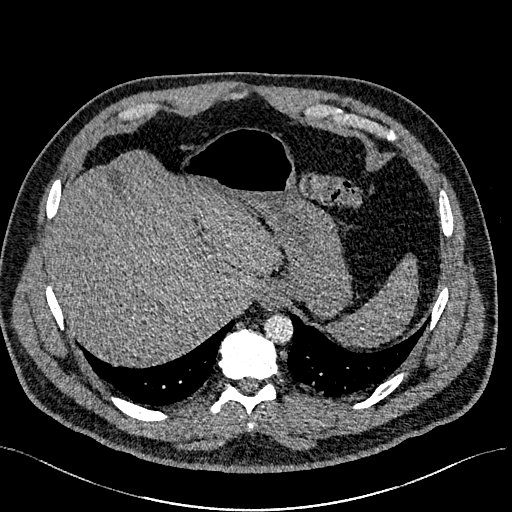
[im 110/437  lung]
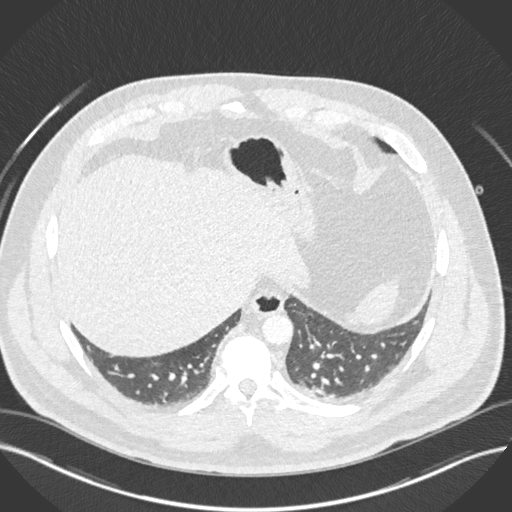
[im 131/437  mediastinal]
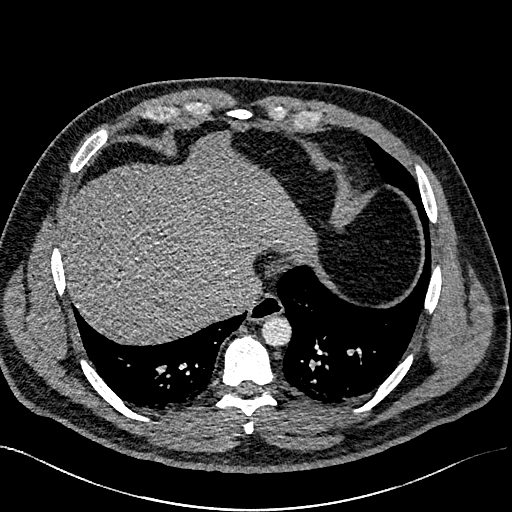
[im 153/437  lung]
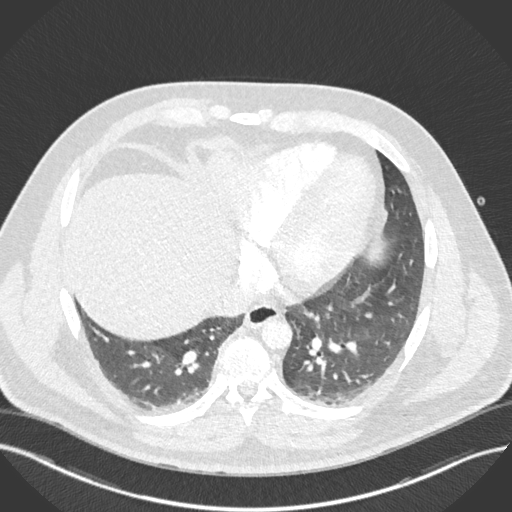
[im 175/437  mediastinal]
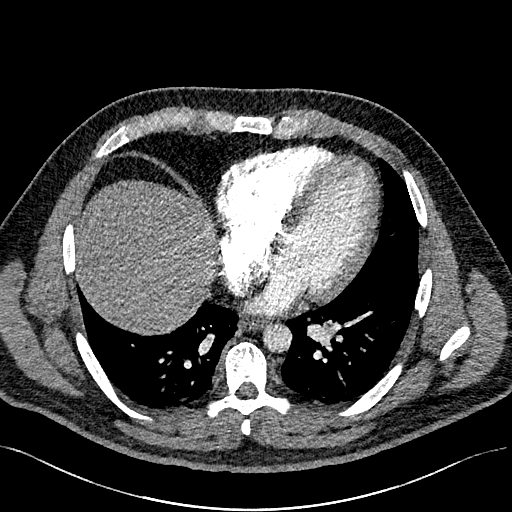
[im 197/437  lung]
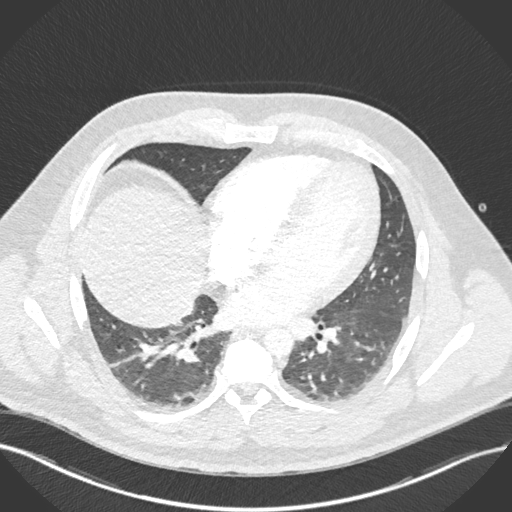
[im 240/437  mediastinal]
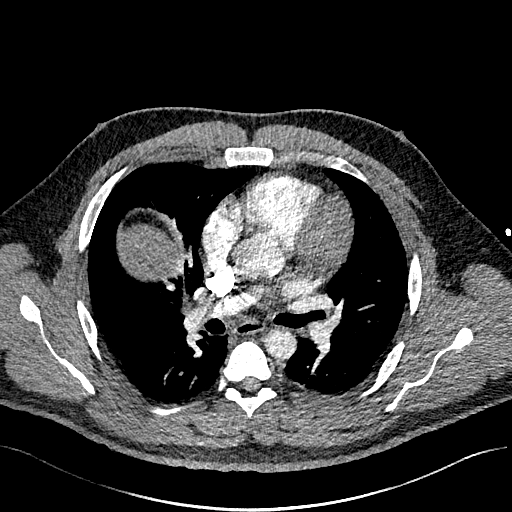
[im 262/437  lung]
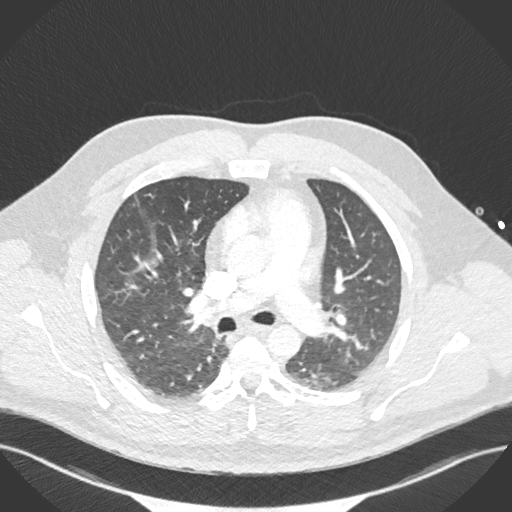
[im 284/437  mediastinal]
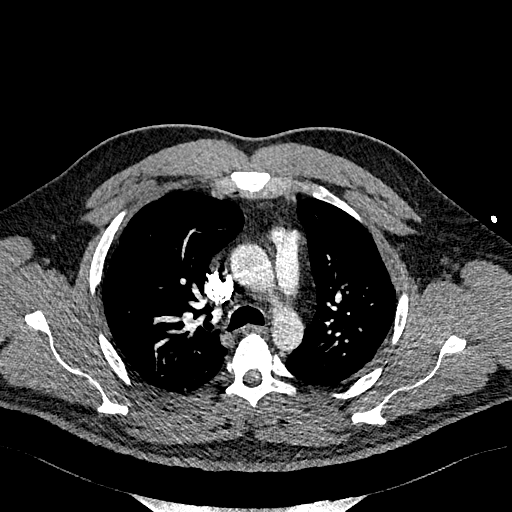
[im 306/437  lung]
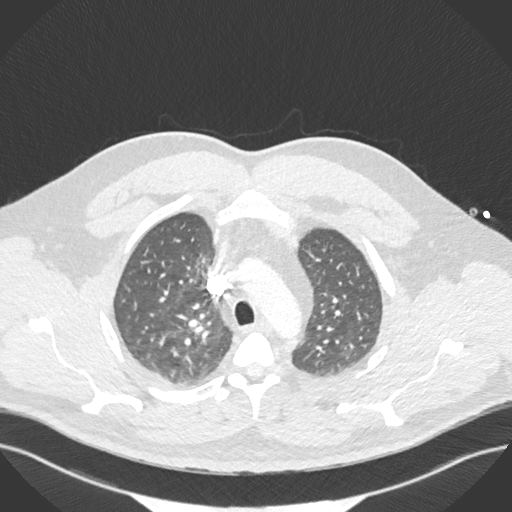
[im 328/437  mediastinal]
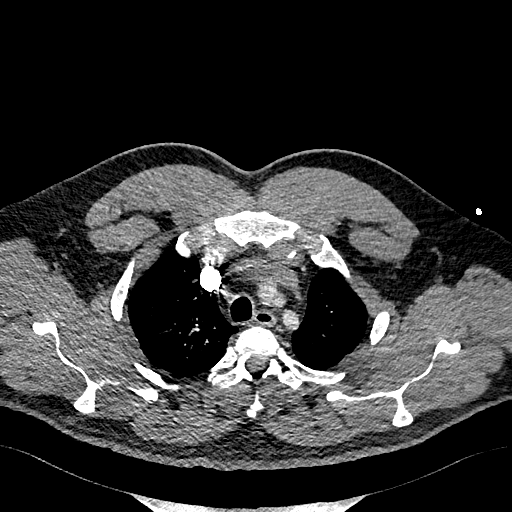
[im 349/437  lung]
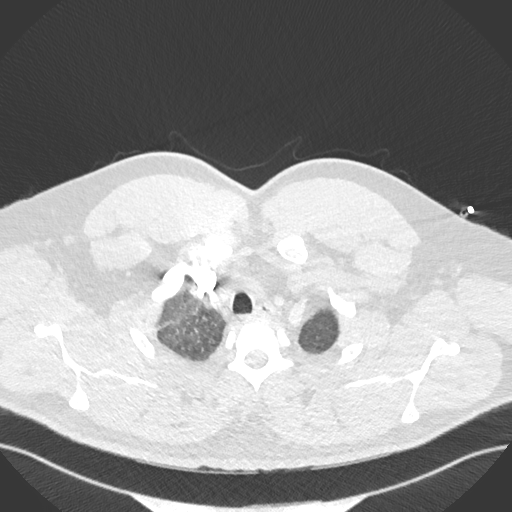
[im 371/437  mediastinal]
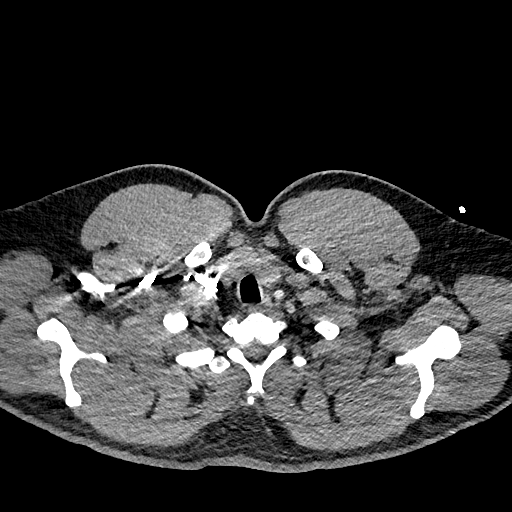
[im 393/437  lung]
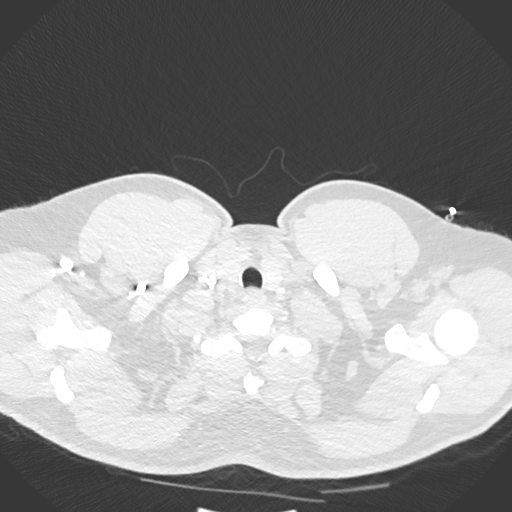
[im 415/437  mediastinal]
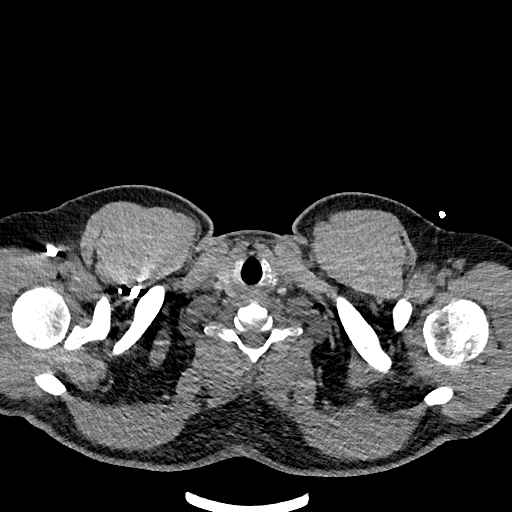

[Series 10: pe 2mm cor · coronal · 0.65mm/px · 1 of 177 slices shown]
[im 89/177  mediastinal]
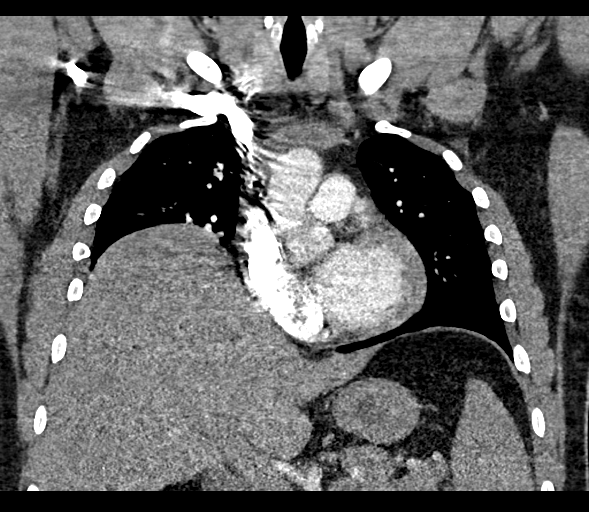

[19 of 36 positions shown; findings below may reference images not displayed]

FINDINGS: Cardiovascular: There is suboptimal opacification of the main
pulmonary artery, however no central pulmonary embolus is seen.
Limited visualization of the segmental and subsegmental pulmonary
arterial branches. The heart is normal in size. No pericardial
effusion or thickening. No evidence right heart strain. There is
normal three-vessel brachiocephalic anatomy without proximal
stenosis. The thoracic aorta is normal in appearance.

Mediastinum/Nodes: No hilar, mediastinal, or axillary adenopathy.
Thyroid gland, trachea, and esophagus demonstrate no significant
findings.

Lungs/Pleura: Streaky atelectasis seen within the right mid lung.
Mild amount of ground-glass opacity at both lung bases.

Upper Abdomen: No acute abnormalities present in the visualized
portions of the upper abdomen.

Musculoskeletal: No chest wall abnormality. No acute or significant
osseous findings.

Review of the MIP images confirms the above findings.
IMPRESSION: Suboptimal opacification of the main pulmonary artery, however no
central pulmonary embolism is seen. Limited visualization of the
subsegmental and segmental pulmonary arteries.

Streaky atelectasis in the right mid lung

## 2022-01-05 ENCOUNTER — Telehealth: Payer: Self-pay | Admitting: Podiatry

## 2022-01-05 NOTE — Telephone Encounter (Signed)
DOS: 02/02/2022  BCBS Effective 02/16/2018  Hallux IPJ Fusion Rt ([id number])  DX: M19.071  Deductible: $2,000 with $993 remaining Out-of-Pocket: $6,500 with $4,846 remaining CoInsurance: 0%  Prior authorization is not required per [facility] automated system.  Confirmation #: [id number]

## 2022-02-02 ENCOUNTER — Other Ambulatory Visit: Payer: Self-pay | Admitting: Podiatry

## 2022-02-02 DIAGNOSIS — G8918 Other acute postprocedural pain: Secondary | ICD-10-CM | POA: Diagnosis not present

## 2022-02-02 DIAGNOSIS — M19071 Primary osteoarthritis, right ankle and foot: Secondary | ICD-10-CM | POA: Diagnosis not present

## 2022-02-02 MED ORDER — IBUPROFEN 800 MG PO TABS: 800.0000 mg | ORAL_TABLET | Freq: Four times a day (QID) | ORAL | 1 refills | Status: DC | PRN

## 2022-02-02 MED ORDER — OXYCODONE-ACETAMINOPHEN 5-325 MG PO TABS
1.0000 | ORAL_TABLET | ORAL | 0 refills | Status: DC | PRN
Start: 2022-02-02 — End: 2022-11-30

## 2022-02-11 ENCOUNTER — Ambulatory Visit (INDEPENDENT_AMBULATORY_CARE_PROVIDER_SITE_OTHER): Payer: BC Managed Care – PPO

## 2022-02-11 ENCOUNTER — Other Ambulatory Visit: Payer: Self-pay | Admitting: Podiatry

## 2022-02-11 ENCOUNTER — Encounter: Payer: Self-pay | Admitting: Podiatry

## 2022-02-11 ENCOUNTER — Other Ambulatory Visit: Payer: Self-pay

## 2022-02-11 ENCOUNTER — Ambulatory Visit (INDEPENDENT_AMBULATORY_CARE_PROVIDER_SITE_OTHER): Payer: BC Managed Care – PPO | Admitting: Podiatry

## 2022-02-11 DIAGNOSIS — M7751 Other enthesopathy of right foot: Secondary | ICD-10-CM

## 2022-02-11 DIAGNOSIS — Z9889 Other specified postprocedural states: Secondary | ICD-10-CM

## 2022-02-11 DIAGNOSIS — M19071 Primary osteoarthritis, right ankle and foot: Secondary | ICD-10-CM

## 2022-02-11 NOTE — Progress Notes (Signed)
  Subjective:  Patient ID: Cameron Steele, male    DOB: [birth year],  MRN: [id number]  Chief Complaint  Patient presents with   Routine Post Op    DOS: 02/02/2022 Procedure: Right IPJ fusion/arthrodesis  46 y.o. male returns for post-op check.  Patient states that he is doing well.  Minimal pain.  No acute complaints been partial weightbearing to the heel.  Bandages clean dry and intact.   Review of Systems: Negative except as noted in the HPI. Denies N/V/F/Ch.  Past Medical History:  Diagnosis Date   High cholesterol    Hypertension     Current Outpatient Medications:    albuterol (VENTOLIN HFA) 108 (90 Base) MCG/ACT inhaler, Inhale 2 puffs into the lungs every 6 (six) hours as needed for wheezing or shortness of breath., Disp: 18 g, Rfl: 5   atorvastatin (LIPITOR) 40 MG tablet, 40 mg., Disp: , Rfl:    esomeprazole (NEXIUM) 40 MG capsule, Take 40 mg by mouth., Disp: , Rfl:    fluticasone-salmeterol (ADVAIR HFA) 115-21 MCG/ACT inhaler, Inhale 2 puffs into the lungs 2 (two) times daily., Disp: 1 each, Rfl: 12   fluvoxaMINE (LUVOX) 100 MG tablet, Take 1 tablet (100 mg total) by mouth at bedtime., Disp: 90 tablet, Rfl: 1   ibuprofen (ADVIL) 800 MG tablet, Take 1 tablet (800 mg total) by mouth every 6 (six) hours as needed., Disp: 60 tablet, Rfl: 1   lithium carbonate (LITHOBID) 300 MG CR tablet, Take 4 tablets (1,200 mg total) by mouth at bedtime., Disp: 360 tablet, Rfl: 1   losartan (COZAAR) 50 MG tablet, 50 mg., Disp: , Rfl:    metFORMIN (GLUCOPHAGE) 500 MG tablet, Take 1 tablet (500 mg total) by mouth daily with breakfast., Disp: 90 tablet, Rfl: 1   oxyCODONE-acetaminophen (PERCOCET) 5-325 MG tablet, Take 1 tablet by mouth every 4 (four) hours as needed for severe pain., Disp: 30 tablet, Rfl: 0   oxyCODONE-acetaminophen (PERCOCET) 5-325 MG tablet, Take 1 tablet by mouth every 4 (four) hours as needed for severe pain., Disp: 30 tablet, Rfl: 0   traZODone (DESYREL) 50 MG tablet, Take 1  tablet (50 mg total) by mouth at bedtime as needed., Disp: 90 tablet, Rfl: 1  Social History   Tobacco Use  Smoking Status Never  Smokeless Tobacco Never    No Known Allergies Objective:  There were no vitals filed for this visit. There is no height or weight on file to calculate BMI. Constitutional Well developed. Well nourished.  Vascular Foot warm and well perfused. Capillary refill normal to all digits.   Neurologic Normal speech. Oriented to person, place, and time. Epicritic sensation to light touch grossly present bilaterally.  Dermatologic Skin healing well without signs of infection. Skin edges well coapted without signs of infection.  Orthopedic: Tenderness to palpation noted about the surgical site.   Radiographs: 3 views of skeletally mature the right foot: Good correction alignment noted reduction of IPJ deformity noted.  Consolidation started to appear at the IPJ site along with some trabeculation. Assessment:   1. Capsulitis of toe, right    Plan:  Patient was evaluated and treated and all questions answered.  S/p foot surgery right -Progressing as expected post-operatively. -XR: See above -WB Status: Partial weightbearing in to the heel in cam boot -Sutures: Intact.  No signs of dehiscence noted no complication noted -Medications: None -Foot redressed.  No follow-ups on file.

## 2022-02-12 ENCOUNTER — Other Ambulatory Visit: Payer: Self-pay | Admitting: Podiatry

## 2022-02-12 MED ORDER — OXYCODONE-ACETAMINOPHEN 5-325 MG PO TABS
1.0000 | ORAL_TABLET | ORAL | 0 refills | Status: DC | PRN
Start: 2022-02-12 — End: 2022-11-30

## 2022-02-25 ENCOUNTER — Ambulatory Visit (INDEPENDENT_AMBULATORY_CARE_PROVIDER_SITE_OTHER): Payer: BC Managed Care – PPO | Admitting: Podiatry

## 2022-02-25 VITALS — BP 122/64

## 2022-02-25 DIAGNOSIS — E785 Hyperlipidemia, unspecified: Secondary | ICD-10-CM | POA: Insufficient documentation

## 2022-02-25 DIAGNOSIS — I10 Essential (primary) hypertension: Secondary | ICD-10-CM | POA: Insufficient documentation

## 2022-02-25 DIAGNOSIS — K759 Inflammatory liver disease, unspecified: Secondary | ICD-10-CM | POA: Insufficient documentation

## 2022-02-25 DIAGNOSIS — M7751 Other enthesopathy of right foot: Secondary | ICD-10-CM

## 2022-02-25 DIAGNOSIS — Z9889 Other specified postprocedural states: Secondary | ICD-10-CM

## 2022-02-25 MED ORDER — OXYCODONE-ACETAMINOPHEN 5-325 MG PO TABS: 1.0000 | ORAL_TABLET | ORAL | 0 refills | Status: DC | PRN

## 2022-02-25 NOTE — Progress Notes (Signed)
Subjective:  Patient ID: Cameron Steele, male    DOB: 1975-04-18,  MRN: [id number]  Chief Complaint  Patient presents with   Routine Post Op    POV #2 DOS 02/02/2022 RT IPJ FUSION  Pt stated that some days are better than others     DOS: 02/02/2022 Procedure: Right IPJ fusion/arthrodesis  47 y.o. male returns for post-op check.  Patient states that he is doing well.  Minimal pain.  No acute complaints been partial weightbearing to the heel.  Bandages clean dry and intact.   Review of Systems: Negative except as noted in the HPI. Denies N/V/F/Ch.  Past Medical History:  Diagnosis Date   High cholesterol    Hypertension     Current Outpatient Medications:    chlorpheniramine-HYDROcodone (TUSSIONEX) 10-8 MG/5ML, Take 5 mL every 12 hours by oral route., Disp: , Rfl:    dicyclomine (BENTYL) 10 MG capsule, SMARTSIG:1 Capsule(s) By Mouth 1-3 Times Daily, Disp: , Rfl:    ondansetron (ZOFRAN-ODT) 4 MG disintegrating tablet, Take 4 mg by mouth every 8 (eight) hours as needed., Disp: , Rfl:    oxyCODONE-acetaminophen (PERCOCET) 5-325 MG tablet, Take 1-2 tablets by mouth every 4 (four) hours as needed for severe pain., Disp: 30 tablet, Rfl: 0   albuterol (VENTOLIN HFA) 108 (90 Base) MCG/ACT inhaler, Inhale 2 puffs into the lungs every 6 (six) hours as needed for wheezing or shortness of breath., Disp: 18 g, Rfl: 5   amoxicillin-clavulanate (AUGMENTIN) 875-125 MG tablet, , Disp: , Rfl:    atorvastatin (LIPITOR) 40 MG tablet, 40 mg., Disp: , Rfl:    azithromycin (ZITHROMAX) 250 MG tablet, TAKE 4 TABLETS BY MOUTH AS A ONE TIME DOSE, Disp: , Rfl:    cefixime (SUPRAX) 400 MG CAPS capsule, TAKE 1 CAPSULE BY MOUTH ONCE DAILY FOR 2 DAYS, Disp: , Rfl:    doxycycline (VIBRA-TABS) 100 MG tablet, TAKE 1 TABLET BY MOUTH TWICE A DAY TAKE WITH FOOD, Disp: , Rfl:    esomeprazole (NEXIUM) 40 MG capsule, Take 40 mg by mouth., Disp: , Rfl:    fluticasone-salmeterol (ADVAIR HFA) 115-21 MCG/ACT inhaler, Inhale 2  puffs into the lungs 2 (two) times daily., Disp: 1 each, Rfl: 12   fluvoxaMINE (LUVOX) 100 MG tablet, Take 1 tablet (100 mg total) by mouth at bedtime., Disp: 90 tablet, Rfl: 1   ibuprofen (ADVIL) 800 MG tablet, Take 1 tablet (800 mg total) by mouth every 6 (six) hours as needed., Disp: 60 tablet, Rfl: 1   levofloxacin (LEVAQUIN) 500 MG tablet, Take 1 tablet every 24 hours by oral route for 10 days., Disp: , Rfl:    lithium carbonate (LITHOBID) 300 MG CR tablet, Take 4 tablets (1,200 mg total) by mouth at bedtime., Disp: 360 tablet, Rfl: 1   losartan (COZAAR) 50 MG tablet, 50 mg., Disp: , Rfl:    metFORMIN (GLUCOPHAGE) 500 MG tablet, Take 1 tablet (500 mg total) by mouth daily with breakfast., Disp: 90 tablet, Rfl: 1   metoCLOPramide (REGLAN) 10 MG tablet, TAKE 1 TAB(S) ORALLY 4 TIMES A DAY (BEFORE MEALS AND AT BEDTIME), Disp: , Rfl:    oxyCODONE-acetaminophen (PERCOCET) 5-325 MG tablet, Take 1 tablet by mouth every 4 (four) hours as needed for severe pain., Disp: 30 tablet, Rfl: 0   oxyCODONE-acetaminophen (PERCOCET) 5-325 MG tablet, Take 1 tablet by mouth every 4 (four) hours as needed for severe pain., Disp: 30 tablet, Rfl: 0   oxyCODONE-acetaminophen (PERCOCET) 5-325 MG tablet, Take 1 tablet by mouth every 4 (four)  hours as needed for severe pain., Disp: 30 tablet, Rfl: 0   penicillin v potassium (VEETID) 500 MG tablet, , Disp: , Rfl:    predniSONE (DELTASONE) 20 MG tablet, , Disp: , Rfl:    QUEtiapine (SEROQUEL) 25 MG tablet, 1-2 TABLETS NIGHTLY FOR SLEEP AS NEEDED, Disp: , Rfl:    sildenafil (VIAGRA) 25 MG tablet, Take 75 mg by mouth daily as needed., Disp: , Rfl:    traMADol (ULTRAM) 50 MG tablet, , Disp: , Rfl:    traZODone (DESYREL) 50 MG tablet, Take 1 tablet (50 mg total) by mouth at bedtime as needed., Disp: 90 tablet, Rfl: 1  Social History   Tobacco Use  Smoking Status Never  Smokeless Tobacco Never    No Known Allergies Objective:   Vitals:   02/25/22 1045  BP: 122/64    There is no height or weight on file to calculate BMI. Constitutional Well developed. Well nourished.  Vascular Foot warm and well perfused. Capillary refill normal to all digits.   Neurologic Normal speech. Oriented to person, place, and time. Epicritic sensation to light touch grossly present bilaterally.  Dermatologic Skin denies: Completely reepithelialized.  No signs of dehiscence noted.  Good reduction noted skin edges well coapted without signs of infection.  Skin has really epithelialized.  Orthopedic: Mild tenderness to palpation noted about the surgical site.   Radiographs: 3 views of skeletally mature the right foot: Good correction alignment noted reduction of IPJ deformity noted.  Consolidation started to appear at the IPJ site along with some trabeculation. Assessment:   No diagnosis found.  Plan:  Patient was evaluated and treated and all questions answered.  S/p foot surgery right -Progressing as expected post-operatively. -XR: See above -WB Status: Weightbearing as tolerated in surgical shoe -Sutures: Removed.  No signs of dehiscence noted no complication noted -Medications: None Patient will begin transition to regular shoes during next clinical visit.  He states understanding  No follow-ups on file.   Right hallux IPJ fusion healing well stitches out transition with surgical shoe

## 2022-03-24 ENCOUNTER — Ambulatory Visit (INDEPENDENT_AMBULATORY_CARE_PROVIDER_SITE_OTHER): Payer: BC Managed Care – PPO | Admitting: Podiatry

## 2022-03-24 ENCOUNTER — Ambulatory Visit (INDEPENDENT_AMBULATORY_CARE_PROVIDER_SITE_OTHER): Payer: BC Managed Care – PPO

## 2022-03-24 VITALS — BP 136/74

## 2022-03-24 DIAGNOSIS — Z9889 Other specified postprocedural states: Secondary | ICD-10-CM

## 2022-03-24 NOTE — Progress Notes (Signed)
Subjective:  Patient ID: Cameron Steele, male    DOB: 23-Feb-1975,  MRN: [id number]  Chief Complaint  Patient presents with   Routine Post Op    POV #3 DOS 02/02/2022 RT IPJ FUSION    DOS: 02/02/2022 Procedure: Right IPJ fusion/arthrodesis  47 y.o. male returns for post-op check.  Patient states that he is doing well.  Minimal pain.  No acute complaints been partial weightbearing to the heel.  Bandages clean dry and intact.   Review of Systems: Negative except as noted in the HPI. Denies N/V/F/Ch.  Past Medical History:  Diagnosis Date   High cholesterol    Hypertension     Current Outpatient Medications:    albuterol (VENTOLIN HFA) 108 (90 Base) MCG/ACT inhaler, Inhale 2 puffs into the lungs every 6 (six) hours as needed for wheezing or shortness of breath., Disp: 18 g, Rfl: 5   amoxicillin-clavulanate (AUGMENTIN) 875-125 MG tablet, , Disp: , Rfl:    atorvastatin (LIPITOR) 40 MG tablet, 40 mg., Disp: , Rfl:    azithromycin (ZITHROMAX) 250 MG tablet, TAKE 4 TABLETS BY MOUTH AS A ONE TIME DOSE, Disp: , Rfl:    cefixime (SUPRAX) 400 MG CAPS capsule, TAKE 1 CAPSULE BY MOUTH ONCE DAILY FOR 2 DAYS, Disp: , Rfl:    chlorpheniramine-HYDROcodone (TUSSIONEX) 10-8 MG/5ML, Take 5 mL every 12 hours by oral route., Disp: , Rfl:    dicyclomine (BENTYL) 10 MG capsule, SMARTSIG:1 Capsule(s) By Mouth 1-3 Times Daily, Disp: , Rfl:    doxycycline (VIBRA-TABS) 100 MG tablet, TAKE 1 TABLET BY MOUTH TWICE A DAY TAKE WITH FOOD, Disp: , Rfl:    esomeprazole (NEXIUM) 40 MG capsule, Take 40 mg by mouth., Disp: , Rfl:    fluticasone-salmeterol (ADVAIR HFA) 115-21 MCG/ACT inhaler, Inhale 2 puffs into the lungs 2 (two) times daily., Disp: 1 each, Rfl: 12   fluvoxaMINE (LUVOX) 100 MG tablet, Take 1 tablet (100 mg total) by mouth at bedtime., Disp: 90 tablet, Rfl: 1   ibuprofen (ADVIL) 800 MG tablet, Take 1 tablet (800 mg total) by mouth every 6 (six) hours as needed., Disp: 60 tablet, Rfl: 1   levofloxacin  (LEVAQUIN) 500 MG tablet, Take 1 tablet every 24 hours by oral route for 10 days., Disp: , Rfl:    lithium carbonate (LITHOBID) 300 MG CR tablet, Take 4 tablets (1,200 mg total) by mouth at bedtime., Disp: 360 tablet, Rfl: 1   losartan (COZAAR) 50 MG tablet, 50 mg., Disp: , Rfl:    metFORMIN (GLUCOPHAGE) 500 MG tablet, Take 1 tablet (500 mg total) by mouth daily with breakfast., Disp: 90 tablet, Rfl: 1   metoCLOPramide (REGLAN) 10 MG tablet, TAKE 1 TAB(S) ORALLY 4 TIMES A DAY (BEFORE MEALS AND AT BEDTIME), Disp: , Rfl:    ondansetron (ZOFRAN-ODT) 4 MG disintegrating tablet, Take 4 mg by mouth every 8 (eight) hours as needed., Disp: , Rfl:    oxyCODONE-acetaminophen (PERCOCET) 5-325 MG tablet, Take 1 tablet by mouth every 4 (four) hours as needed for severe pain., Disp: 30 tablet, Rfl: 0   oxyCODONE-acetaminophen (PERCOCET) 5-325 MG tablet, Take 1 tablet by mouth every 4 (four) hours as needed for severe pain., Disp: 30 tablet, Rfl: 0   oxyCODONE-acetaminophen (PERCOCET) 5-325 MG tablet, Take 1 tablet by mouth every 4 (four) hours as needed for severe pain., Disp: 30 tablet, Rfl: 0   oxyCODONE-acetaminophen (PERCOCET) 5-325 MG tablet, Take 1-2 tablets by mouth every 4 (four) hours as needed for severe pain., Disp: 30 tablet, Rfl: 0  penicillin v potassium (VEETID) 500 MG tablet, , Disp: , Rfl:    predniSONE (DELTASONE) 20 MG tablet, , Disp: , Rfl:    QUEtiapine (SEROQUEL) 25 MG tablet, 1-2 TABLETS NIGHTLY FOR SLEEP AS NEEDED, Disp: , Rfl:    sildenafil (VIAGRA) 25 MG tablet, Take 75 mg by mouth daily as needed., Disp: , Rfl:    traMADol (ULTRAM) 50 MG tablet, , Disp: , Rfl:    traZODone (DESYREL) 50 MG tablet, Take 1 tablet (50 mg total) by mouth at bedtime as needed., Disp: 90 tablet, Rfl: 1  Social History   Tobacco Use  Smoking Status Never  Smokeless Tobacco Never    No Known Allergies Objective:   Vitals:   03/24/22 0943  BP: 136/74   There is no height or weight on file to  calculate BMI. Constitutional Well developed. Well nourished.  Vascular Foot warm and well perfused. Capillary refill normal to all digits.   Neurologic Normal speech. Oriented to person, place, and time. Epicritic sensation to light touch grossly present bilaterally.  Dermatologic Skin denies: Completely reepithelialized.  No signs of dehiscence noted.  Good reduction noted skin edges well coapted without signs of infection.  Skin has really epithelialized.  Orthopedic: Mild tenderness to palpation noted about the surgical site.   Radiographs: 3 views of skeletally mature the right foot: Good correction alignment noted reduction of IPJ deformity noted.  Consolidation started to appear at the IPJ site along with some trabeculation. Assessment:   1. Status post foot surgery     Plan:  Patient was evaluated and treated and all questions answered.  S/p foot surgery right -Clinically healed skin has completely epithelialized.  Good correction alignment noted of all the toes.  At this time if any foot and ankle issues or in the future she will come back and see me.

## 2022-04-16 DIAGNOSIS — I10 Essential (primary) hypertension: Secondary | ICD-10-CM | POA: Diagnosis not present

## 2022-04-16 DIAGNOSIS — R946 Abnormal results of thyroid function studies: Secondary | ICD-10-CM | POA: Diagnosis not present

## 2022-04-16 DIAGNOSIS — E7800 Pure hypercholesterolemia, unspecified: Secondary | ICD-10-CM | POA: Diagnosis not present

## 2022-04-16 DIAGNOSIS — E669 Obesity, unspecified: Secondary | ICD-10-CM | POA: Diagnosis not present

## 2022-06-27 ENCOUNTER — Emergency Department (HOSPITAL_BASED_OUTPATIENT_CLINIC_OR_DEPARTMENT_OTHER)
Admission: EM | Admit: 2022-06-27 | Discharge: 2022-06-27 | Disposition: A | Discharge status: Home / Self Care | Payer: BC Managed Care – PPO | Attending: Emergency Medicine | Admitting: Emergency Medicine

## 2022-06-27 ENCOUNTER — Encounter (HOSPITAL_BASED_OUTPATIENT_CLINIC_OR_DEPARTMENT_OTHER): Payer: Self-pay | Admitting: Emergency Medicine

## 2022-06-27 DIAGNOSIS — R799 Abnormal finding of blood chemistry, unspecified: Secondary | ICD-10-CM | POA: Diagnosis not present

## 2022-06-27 DIAGNOSIS — Z79899 Other long term (current) drug therapy: Secondary | ICD-10-CM | POA: Diagnosis not present

## 2022-06-27 DIAGNOSIS — R5383 Other fatigue: Secondary | ICD-10-CM | POA: Diagnosis not present

## 2022-06-27 DIAGNOSIS — R251 Tremor, unspecified: Secondary | ICD-10-CM | POA: Insufficient documentation

## 2022-06-27 DIAGNOSIS — I10 Essential (primary) hypertension: Secondary | ICD-10-CM | POA: Insufficient documentation

## 2022-06-27 HISTORY — DX: Obsessive-compulsive disorder, unspecified: F42.9

## 2022-06-27 HISTORY — DX: Bipolar disorder, unspecified: F31.9

## 2022-06-27 LAB — COMPREHENSIVE METABOLIC PANEL
ALT: 110 U/L — ABNORMAL HIGH (ref 0–44)
AST: 78 U/L — ABNORMAL HIGH (ref 15–41)
Albumin: 4.5 g/dL (ref 3.5–5.0)
Alkaline Phosphatase: 100 U/L (ref 38–126)
Anion gap: 8 (ref 5–15)
BUN: 24 mg/dL — ABNORMAL HIGH (ref 6–20)
CO2: 25 mmol/L (ref 22–32)
Calcium: 10.5 mg/dL — ABNORMAL HIGH (ref 8.9–10.3)
Chloride: 106 mmol/L (ref 98–111)
Creatinine, Ser: 1.22 mg/dL (ref 0.61–1.24)
GFR, Estimated: >60 mL/min (ref >60)
Glucose, Bld: 86 mg/dL (ref 70–99)
Potassium: 3.7 mmol/L (ref 3.5–5.1)
Sodium: 139 mmol/L (ref 135–145)
Total Bilirubin: 0.5 mg/dL (ref 0.3–1.2)
Total Protein: 6.8 g/dL (ref 6.5–8.1)

## 2022-06-27 LAB — SALICYLATE LEVEL: Salicylate Lvl: <7 mg/dL — ABNORMAL LOW (ref 7.0–30.0)

## 2022-06-27 LAB — CBC WITH DIFFERENTIAL/PLATELET
Abs Immature Granulocytes: 0.03 10*3/uL (ref 0.00–0.07)
Basophils Absolute: 0 10*3/uL (ref 0.0–0.1)
Basophils Relative: 1 %
Eosinophils Absolute: 0.1 10*3/uL (ref 0.0–0.5)
Eosinophils Relative: 1 %
HCT: 43.1 % (ref 39.0–52.0)
Hemoglobin: 14.5 g/dL (ref 13.0–17.0)
Immature Granulocytes: 0 %
Lymphocytes Relative: 11 %
Lymphs Abs: 0.9 10*3/uL (ref 0.7–4.0)
MCH: 29.7 pg (ref 26.0–34.0)
MCHC: 33.6 g/dL (ref 30.0–36.0)
MCV: 88.3 fL (ref 80.0–100.0)
Monocytes Absolute: 0.4 10*3/uL (ref 0.1–1.0)
Monocytes Relative: 5 %
Neutro Abs: 6.3 10*3/uL (ref 1.7–7.7)
Neutrophils Relative %: 82 %
Platelets: 267 10*3/uL (ref 150–400)
RBC: 4.88 MIL/uL (ref 4.22–5.81)
RDW: 15.9 % — ABNORMAL HIGH (ref 11.5–15.5)
WBC: 7.7 10*3/uL (ref 4.0–10.5)
nRBC: 0 % (ref 0.0–0.2)

## 2022-06-27 LAB — MAGNESIUM: Magnesium: 1.9 mg/dL (ref 1.7–2.4)

## 2022-06-27 LAB — TSH: TSH: 2.219 u[IU]/mL (ref 0.350–4.500)

## 2022-06-27 LAB — ACETAMINOPHEN LEVEL: Acetaminophen (Tylenol), Serum: <10 ug/mL — ABNORMAL LOW (ref 10–30)

## 2022-06-27 LAB — LITHIUM LEVEL: Lithium Lvl: 1.14 mmol/L (ref 0.60–1.20)

## 2022-06-27 MED ORDER — SODIUM CHLORIDE 0.9 % IV BOLUS
1000.0000 mL | Freq: Once | INTRAVENOUS | Status: AC
  Administered 2022-06-27: 1000 mL via INTRAVENOUS

## 2022-06-27 NOTE — ED Provider Notes (Signed)
Handoff from [facility], pending lithium lab.  Physical Exam  BP 129/79 (BP Location: Right Arm)   Pulse 73   Temp 98.9 F (37.2 C)   Resp 20   SpO2 99%   Physical Exam Vitals and nursing note reviewed.  Constitutional:      General: He is not in acute distress.    Appearance: He is well-developed.  HENT:     Head: Normocephalic and atraumatic.  Eyes:     Conjunctiva/sclera: Conjunctivae normal.  Cardiovascular:     Rate and Rhythm: Normal rate and regular rhythm.     Heart sounds: No murmur heard. Pulmonary:     Effort: Pulmonary effort is normal. No respiratory distress.     Breath sounds: Normal breath sounds.  Abdominal:     Palpations: Abdomen is soft.     Tenderness: There is no abdominal tenderness.  Musculoskeletal:        General: No swelling.     Cervical back: Neck supple.  Skin:    General: Skin is warm and dry.     Capillary Refill: Capillary refill takes less than 2 seconds.  Neurological:     Mental Status: He is alert.  Psychiatric:        Mood and Affect: Mood normal.     Procedures  Procedures  ED Course / MDM    Medical Decision Making Patient is a 47 year old male, here for abnormal labs outpatient, he states his lithium lab was elevated, and his kidney function was elevated.  He has been on a binge of fasting.  Received handoff pending lithium level, lithium level within normal limits 1.15, I discussed this with the patient, his lithium level is within normal limits, his kidney function is very similar to last years, he should follow-up with his primary care doctor for further evaluation of fatigue.  Discharged home.  Amount and/or Complexity of Data Reviewed Labs: ordered.         Pete Pelt, [place] 06/27/22 1953    Benjiman Core, MD 06/27/22 2328

## 2022-06-27 NOTE — ED Provider Notes (Signed)
[place] EMERGENCY DEPARTMENT AT [facility] Provider Note   CSN: [id number] Arrival date & time: 06/27/22  1642     History  Chief Complaint  Patient presents with   abnormal labs    Ashai Bunce is a 47 y.o. male.  47 year old male brought in by wife with concern for abnormal lab work.  Patient receives telepsych care, is on lithium.  States that he has been doing fasts lasting from 3 to 8 days (drinks only water and electrolytes during this time).  He completed an 8-day fast on Wednesday, May 8.  States that on Thursday he was feeling very shaky and with brain fog, unable to do his workout at the gym.  Did a televisit and had labs drawn.  Was notified today that his creatinine is elevated 1.52 (previously 1.15), lithium is elevated at 2.5 (previously 0.6).  Patient went to an IV hydration bar yesterday for IV fluids and vitamins, is feeling somewhat better today.  Denies nausea, vomiting, diarrhea.       Home Medications Prior to Admission medications   Medication Sig Start Date End Date Taking? Authorizing Provider  albuterol (VENTOLIN HFA) 108 (90 Base) MCG/ACT inhaler Inhale 2 puffs into the lungs every 6 (six) hours as needed for wheezing or shortness of breath. 02/21/21   Charlott Holler, MD  amoxicillin-clavulanate (AUGMENTIN) 875-125 MG tablet     [provider]  atorvastatin (LIPITOR) 40 MG tablet 40 mg. 11/05/19   [provider]  azithromycin (ZITHROMAX) 250 MG tablet TAKE 4 TABLETS BY MOUTH AS A ONE TIME DOSE    [provider]  cefixime (SUPRAX) 400 MG CAPS capsule TAKE 1 CAPSULE BY MOUTH ONCE DAILY FOR 2 DAYS    [provider]  chlorpheniramine-HYDROcodone (TUSSIONEX) 10-8 MG/5ML Take 5 mL every 12 hours by oral route. 01/24/16   [provider]  dicyclomine (BENTYL) 10 MG capsule SMARTSIG:1 Capsule(s) By Mouth 1-3 Times Daily 11/26/21   [provider]  doxycycline (VIBRA-TABS) 100 MG tablet TAKE 1 TABLET  BY MOUTH TWICE A DAY TAKE WITH FOOD    [provider]  esomeprazole (NEXIUM) 40 MG capsule Take 40 mg by mouth.    [provider]  fluticasone-salmeterol (ADVAIR HFA) 115-21 MCG/ACT inhaler Inhale 2 puffs into the lungs 2 (two) times daily. 02/21/21   Charlott Holler, MD  fluvoxaMINE (LUVOX) 100 MG tablet Take 1 tablet (100 mg total) by mouth at bedtime. 09/09/17   Burnard Leigh, MD  ibuprofen (ADVIL) 800 MG tablet Take 1 tablet (800 mg total) by mouth every 6 (six) hours as needed. 02/02/22   Candelaria Stagers, DPM  levofloxacin (LEVAQUIN) 500 MG tablet Take 1 tablet every 24 hours by oral route for 10 days.    [provider]  lithium carbonate (LITHOBID) 300 MG CR tablet Take 4 tablets (1,200 mg total) by mouth at bedtime. 09/09/17   Burnard Leigh, MD  losartan (COZAAR) 50 MG tablet 50 mg. 11/05/19   [provider]  metFORMIN (GLUCOPHAGE) 500 MG tablet Take 1 tablet (500 mg total) by mouth daily with breakfast. 09/09/17 09/09/18  Eksir, Bo Mcclintock, MD  metoCLOPramide (REGLAN) 10 MG tablet TAKE 1 TAB(S) ORALLY 4 TIMES A DAY (BEFORE MEALS AND AT BEDTIME)    [provider]  ondansetron (ZOFRAN-ODT) 4 MG disintegrating tablet Take 4 mg by mouth every 8 (eight) hours as needed. 11/26/21   [provider]  oxyCODONE-acetaminophen (PERCOCET) 5-325 MG tablet Take 1 tablet  by mouth every 4 (four) hours as needed for severe pain. 12/26/21   Candelaria Stagers, DPM  oxyCODONE-acetaminophen (PERCOCET) 5-325 MG tablet Take 1 tablet by mouth every 4 (four) hours as needed for severe pain. 02/02/22   Candelaria Stagers, DPM  oxyCODONE-acetaminophen (PERCOCET) 5-325 MG tablet Take 1 tablet by mouth every 4 (four) hours as needed for severe pain. 02/12/22   Candelaria Stagers, DPM  oxyCODONE-acetaminophen (PERCOCET) 5-325 MG tablet Take 1-2 tablets by mouth every 4 (four) hours as needed for severe pain. 02/25/22   Candelaria Stagers, DPM  penicillin v potassium  (VEETID) 500 MG tablet     [provider]  predniSONE (DELTASONE) 20 MG tablet     [provider]  QUEtiapine (SEROQUEL) 25 MG tablet 1-2 TABLETS NIGHTLY FOR SLEEP AS NEEDED    [provider]  sildenafil (VIAGRA) 25 MG tablet Take 75 mg by mouth daily as needed.    [provider]  traMADol (ULTRAM) 50 MG tablet     [provider]  traZODone (DESYREL) 50 MG tablet Take 1 tablet (50 mg total) by mouth at bedtime as needed. 09/09/17   Eksir, Bo Mcclintock, MD      Allergies    Patient has no known allergies.    Review of Systems   Review of Systems Negative except as per HPI Physical Exam Updated Vital Signs BP 129/79 (BP Location: Right Arm)   Pulse 73   Temp 98.9 F (37.2 C)   Resp 20   SpO2 99%  Physical Exam Vitals and nursing note reviewed.  Constitutional:      General: He is not in acute distress.    Appearance: He is well-developed. He is not diaphoretic.  HENT:     Head: Normocephalic and atraumatic.     Mouth/Throat:     Mouth: Mucous membranes are moist.  Eyes:     Conjunctiva/sclera: Conjunctivae normal.  Cardiovascular:     Rate and Rhythm: Normal rate and regular rhythm.     Heart sounds: Normal heart sounds.  Pulmonary:     Effort: Pulmonary effort is normal.     Breath sounds: Normal breath sounds.  Abdominal:     Palpations: Abdomen is soft.     Tenderness: There is no abdominal tenderness.  Musculoskeletal:     Right lower leg: No edema.     Left lower leg: No edema.  Skin:    General: Skin is warm and dry.     Findings: No erythema or rash.  Neurological:     Mental Status: He is alert and oriented to person, place, and time.  Psychiatric:        Behavior: Behavior normal.     ED Results / Procedures / Treatments   Labs (all labs ordered are listed, but only abnormal results are displayed) Labs Reviewed  CBC WITH DIFFERENTIAL/PLATELET - Abnormal; Notable for the following components:       Result Value   RDW 15.9 (*)    All other components within normal limits  COMPREHENSIVE METABOLIC PANEL - Abnormal; Notable for the following components:   BUN 24 (*)    Calcium 10.5 (*)    AST 78 (*)    ALT 110 (*)    All other components within normal limits  SALICYLATE LEVEL - Abnormal; Notable for the following components:   Salicylate Lvl <7.0 (*)    All other components within normal limits  ACETAMINOPHEN LEVEL - Abnormal; Notable for the following  components:   Acetaminophen (Tylenol), Serum <10 (*)    All other components within normal limits  MAGNESIUM  TSH  LITHIUM LEVEL    EKG EKG Interpretation  Date/Time:  Saturday Jun 27 2022 17:10:39 EDT Ventricular Rate:  76 PR Interval:  160 QRS Duration: 110 QT Interval:  363 QTC Calculation: 409 R Axis:   50 Text Interpretation: Sinus rhythm Abnormal R-wave progression, early transition Confirmed by Benjiman Core [phone]) on 06/27/2022 5:24:23 PM  Radiology No results found.  Procedures Procedures    Medications Ordered in ED Medications  sodium chloride 0.9 % bolus 1,000 mL ( Intravenous Stopped 06/27/22 1826)    ED Course/ Medical Decision Making/ A&P                             Medical Decision Making Amount and/or Complexity of Data Reviewed Labs: ordered.   This patient presents to the ED for concern of lithium toxicity, this involves an extensive number of treatment options, and is a complaint that carries with it a high risk of complications and morbidity.  The differential diagnosis includes but not limited to   Co morbidities that complicate the patient evaluation  Hepatitis, hyperlipidemia, hypertension, OCD, bipolar 1 disorder   Additional history obtained:  Additional history obtained from wife at bedside who contributes to history as above External records from outside source obtained and reviewed including labs brought in by patient drawn earlier on 06/25/2022, scanned into  chart.   Lab Tests:  I Ordered, and personally interpreted labs.  The pertinent results include: CBC without significant findings.  CMP with mildly elevated AST at 78 and ALT at 110.  Tylenol and salicylate levels negative.  Magnesium within normal notes.  TSH within normal limits.  Lithium level- pending at time of sign out.    Cardiac Monitoring: / EKG:  The patient was maintained on a cardiac monitor.  I personally viewed and interpreted the cardiac monitored which showed an underlying rhythm of: Sinus rhythm, rate 76   Consultations Obtained:  I requested consultation with the ER attending, Dr. Rubin Payor,  and discussed lab and imaging findings as well as pertinent plan - they recommend: Disposition pending lithium repeat   Problem List / ED Course / Critical interventions / Medication management  47 year old male presents with concern for elevated lithium level drawn 2 days ago at 2.5.  Additional history as above.  Went to an IV hydration bar yesterday, feels like his symptoms of dizziness, brain fog, fatigue have improved. I have reviewed the patients home medicines and have made adjustments as needed   Social Determinants of Health:  Lives with wife   Test / Admission - Considered:  Dispo pending at time of signout to oncoming provider awaiting lithium level         Final Clinical Impression(s) / ED Diagnoses Final diagnoses:  None    Rx / DC Orders ED Discharge Orders     None         Alden Hipp 06/27/22 1844    Benjiman Core, MD 06/27/22 2328

## 2022-06-27 NOTE — Discharge Instructions (Signed)
To date your labs are reassuring, your kidney function appears to be similar to last years, as well as your lithium numbers are within normal limits.  You will will need to follow-up with your primary care doctor for further evaluation for your fatigue.  I encourage you to eat a healthy diet, and do not fast for long periods of time.  Return to the ER if you have syncope, chest pain, nausea, vomiting, or weakness.

## 2022-06-27 NOTE — ED Triage Notes (Signed)
Pt has been fasting ( new for him), went to MD for checkup and had labs. Today was told to go ED. Cr is 1.5 and lithium is toxic at 2.5,wife told to decrease lithium dose and she did. Pt also has lost about 40 intentional pounds past one or so. Dizzy, brain fog,fatigue.

## 2022-08-18 DIAGNOSIS — I10 Essential (primary) hypertension: Secondary | ICD-10-CM | POA: Diagnosis not present

## 2022-08-18 DIAGNOSIS — E7800 Pure hypercholesterolemia, unspecified: Secondary | ICD-10-CM | POA: Diagnosis not present

## 2022-08-18 DIAGNOSIS — R809 Proteinuria, unspecified: Secondary | ICD-10-CM | POA: Diagnosis not present

## 2022-09-07 DIAGNOSIS — R944 Abnormal results of kidney function studies: Secondary | ICD-10-CM | POA: Diagnosis not present

## 2022-09-15 ENCOUNTER — Ambulatory Visit
Admission: RE | Admit: 2022-09-15 | Discharge: 2022-09-15 | Disposition: A | Discharge status: Home / Self Care | Payer: BC Managed Care – PPO | Source: Ambulatory Visit | Attending: Physician Assistant | Admitting: Physician Assistant

## 2022-09-15 ENCOUNTER — Other Ambulatory Visit: Payer: Self-pay | Admitting: Physician Assistant

## 2022-09-15 DIAGNOSIS — R590 Localized enlarged lymph nodes: Secondary | ICD-10-CM

## 2022-09-15 DIAGNOSIS — R1909 Other intra-abdominal and pelvic swelling, mass and lump: Secondary | ICD-10-CM | POA: Diagnosis not present

## 2022-09-15 DIAGNOSIS — L03314 Cellulitis of groin: Secondary | ICD-10-CM | POA: Diagnosis not present

## 2022-09-15 DIAGNOSIS — R599 Enlarged lymph nodes, unspecified: Secondary | ICD-10-CM | POA: Diagnosis not present

## 2022-09-15 DIAGNOSIS — K561 Intussusception: Secondary | ICD-10-CM | POA: Diagnosis not present

## 2022-09-15 MED ORDER — IOPAMIDOL (ISOVUE-300) INJECTION 61%
100.0000 mL | Freq: Once | INTRAVENOUS | Status: AC | PRN
  Administered 2022-09-15: 100 mL via INTRAVENOUS

## 2022-09-18 DIAGNOSIS — R599 Enlarged lymph nodes, unspecified: Secondary | ICD-10-CM | POA: Diagnosis not present

## 2022-10-09 DIAGNOSIS — R944 Abnormal results of kidney function studies: Secondary | ICD-10-CM | POA: Diagnosis not present

## 2022-10-09 DIAGNOSIS — R809 Proteinuria, unspecified: Secondary | ICD-10-CM | POA: Diagnosis not present

## 2022-10-10 DIAGNOSIS — E875 Hyperkalemia: Secondary | ICD-10-CM | POA: Diagnosis not present

## 2022-11-30 ENCOUNTER — Ambulatory Visit (INDEPENDENT_AMBULATORY_CARE_PROVIDER_SITE_OTHER): Payer: BC Managed Care – PPO

## 2022-11-30 ENCOUNTER — Ambulatory Visit (INDEPENDENT_AMBULATORY_CARE_PROVIDER_SITE_OTHER): Payer: BC Managed Care – PPO | Admitting: Family Medicine

## 2022-11-30 ENCOUNTER — Encounter: Payer: Self-pay | Admitting: Family Medicine

## 2022-11-30 ENCOUNTER — Other Ambulatory Visit: Payer: Self-pay

## 2022-11-30 VITALS — BP 124/80 | HR 104 | Ht 71.0 in | Wt 221.0 lb

## 2022-11-30 DIAGNOSIS — M79602 Pain in left arm: Secondary | ICD-10-CM

## 2022-11-30 DIAGNOSIS — M25522 Pain in left elbow: Secondary | ICD-10-CM | POA: Diagnosis not present

## 2022-11-30 NOTE — Progress Notes (Signed)
   I, Stevenson Clinch, CMA acting as a scribe for Clementeen Graham, MD.  Cameron Steele is a 47 y.o. male who presents to [facility] at [facility] today for L arm pain x 1 month, worsening over the weekend. Pt locates pain to medial aspect of the elbow. Notes TTP, pain when doing curls (wrist and elbow extension). Denies visible swelling. Notes weakness in the arm since this past weekend. Denies n/t, decreased grip strength.   Radiates: dull pain throughout the joint Paresthesia: no Grip strength: no Aggravates: wrist and elbow extension Treatments tried: compression, ice, IBU  Pertinent review of systems: No fevers or chills  Relevant historical information: Hypertension    Exam:  BP 124/80   Pulse (!) 104   Ht 5\' 11"  (1.803 m)   Wt 221 lb (100.2 kg)   SpO2 98%   BMI 30.82 kg/m  General: Well Developed, well nourished, and in no acute distress.   MSK: Left elbow well-developed musculature.  Normal-appearing otherwise. Normal motion. Tender palpation at medial epicondyle. Elbow strength is intact to flexion and extension. Patient has strength to wrist flexion extension pronation supination.  He experiences pain in the medial elbow with wrist extension and with pronation.  He has some pain with resisted wrist flexion. Grip strength is intact. Pulses cap refill and sensation are intact distally.    Lab and Radiology Results  Diagnostic Limited MSK Ultrasound of: Left elbow Common flexor tendon origin at medial epicondyle visualized.  No definitive tear but there is a line of increased vascular activity on Doppler that could represent a linear split tear. Lateral epicondyle normal-appearing Impression: Possible partial tear medial epicondyle versus medial epicondylitis  X-ray images left elbow obtained today personally and independently interpreted No acute fractures.  No severe degenerative changes.  Relatively normal-appearing elbow Await formal radiology  review   Assessment and Plan: 47 y.o. male with left medial elbow pain.  Location of this pain is classic for medial epicondylitis.  He has more pain with extension of the knee does flexion which is atypical.  Plan for home exercise program and Voltaren gel.  If not improved in a few weeks he will let me know and we can get him set up with occupational therapy.  If needed we could do MRI or injection.   PDMP not reviewed this encounter. Orders Placed This Encounter  Procedures   [place] LIMITED JOINT SPACE STRUCTURES UP LEFT(NO LINKED CHARGES)    Order Specific Question:   Reason for Exam (SYMPTOM  OR DIAGNOSIS REQUIRED)    Answer:   left arm pain    Order Specific Question:   Preferred imaging location?    Answer:   [facility] ELBOW COMPLETE LEFT (3+VIEW)    Standing Status:   Future    Number of Occurrences:   1    Standing Expiration Date:   12/31/2022    Order Specific Question:   Reason for Exam (SYMPTOM  OR DIAGNOSIS REQUIRED)    Answer:   left elbow pain    Order Specific Question:   Preferred imaging location?    Answer:   [facility]   No orders of the defined types were placed in this encounter.    Discussed warning signs or symptoms. Please see discharge instructions. Patient expresses understanding.   The above documentation has been reviewed and is accurate and complete Clementeen Graham, M.D.

## 2022-11-30 NOTE — Patient Instructions (Addendum)
Thank you for coming in today.   Please get an Xray today before you leave   Please complete the exercises that the athletic trainer went over with you:  View at [email or web address]-exercise-[email or web address] using code: [email or web address]  Please use Voltaren gel (Generic Diclofenac Gel) up to 4x daily for pain as needed.  This is available over-the-counter as both the name brand Voltaren gel and the generic diclofenac gel.   Try a thumb loop brace  Let me know if not better in 2 weeks, let me know and we can refer you to occupational therapy.

## 2022-12-04 NOTE — Progress Notes (Signed)
Left elbow x-ray looks okay to radiology.

## 2023-01-04 NOTE — Progress Notes (Deleted)
   Rubin Payor, PhD, LAT, ATC acting as a scribe for Clementeen Graham, MD.  Cameron Steele is a 47 y.o. male who presents to [facility] at [facility] today for cont'd L elbow pain. Pt was last seen by Dr. Denyse Amass on 11/30/22 and was taught HEP and advised to use Voltaren gel.  Today, pt reports ***. Pt locates pain to ***  Dx imaging: 11/30/22 L elbow XR  Pertinent review of systems: ***  Relevant historical information: ***   Exam:  There were no vitals taken for this visit. General: Well Developed, well nourished, and in no acute distress.   MSK: ***    Lab and Radiology Results No results found for this or any previous visit (from the past 72 hour(s)). No results found.     Assessment and Plan: 47 y.o. male with ***   PDMP not reviewed this encounter. No orders of the defined types were placed in this encounter.  No orders of the defined types were placed in this encounter.    Discussed warning signs or symptoms. Please see discharge instructions. Patient expresses understanding.   ***

## 2023-01-05 ENCOUNTER — Ambulatory Visit: Payer: BC Managed Care – PPO | Admitting: Family Medicine

## 2023-01-19 ENCOUNTER — Other Ambulatory Visit: Payer: Self-pay

## 2023-01-19 ENCOUNTER — Ambulatory Visit: Payer: BC Managed Care – PPO | Admitting: Family Medicine

## 2023-01-19 ENCOUNTER — Encounter: Payer: Self-pay | Admitting: Family Medicine

## 2023-01-19 VITALS — BP 142/80 | HR 98 | Ht 71.0 in | Wt 216.0 lb

## 2023-01-19 DIAGNOSIS — M25522 Pain in left elbow: Secondary | ICD-10-CM | POA: Diagnosis not present

## 2023-01-19 NOTE — Patient Instructions (Signed)
Thank you for coming in today.   You should hear from MRI scheduling within 1 week. If you do not hear please let me know.    I've referred you to Occupational Therapy.  Let us know if you don't hear from them in one week.

## 2023-01-19 NOTE — Progress Notes (Signed)
   I, Stevenson Clinch, CMA acting as a scribe for Clementeen Graham, MD.  Cameron Steele is a 47 y.o. male who presents to [facility] at [facility] today for cont'd L elbow pain. Pt was last seen by Dr. Denyse Amass on 11/30/22 and was taught HEP and advised to use Voltaren gel.  Today, pt reports continued left elbow pain, worse with use. Pt locates pain to medial aspect. Denies swelling, n/t  Does note some decrease in grip strength. Compliant with HEP. Minimal relief with Voltaren.   Dx imaging: 11/30/22 L elbow XR   Pertinent review of systems: No fevers or chills  Relevant historical information: Hypertension   Exam:  BP (!) 142/80   Pulse 98   Ht 5\' 11"  (1.803 m)   Wt 216 lb (98 kg)   SpO2 100%   BMI 30.13 kg/m  General: Well Developed, well nourished, and in no acute distress.   MSK: Left elbow normal-appearing Tender palpation medial epicondyle.  Pain with resisted wrist flexion. No laxity to UCL stress test.    Lab and Radiology Results EXAM: LEFT ELBOW - COMPLETE 4 VIEW   COMPARISON:  None Available.   FINDINGS: There is no evidence of fracture, dislocation, or joint effusion. There is no evidence of arthropathy or other focal bone abnormality. Soft tissues are unremarkable.   IMPRESSION: Negative.     Electronically Signed   By: Layla Maw M.D.   On: 12/04/2022 11:19   I, Clementeen Graham, personally (independently) visualized and performed the interpretation of the images attached in this note.     Assessment and Plan: 47 y.o. male with persistent right elbow pain.  He has had 6 weeks of physician directed conservative management including home exercise program.  Unfortunately he is no better.  Plan for MRI to further characterize source of pain and for surgery or injection planning. While we are waiting on the MRI we will go ahead and add occupational therapy now.   PDMP not reviewed this encounter. Orders Placed This Encounter  Procedures    MR ELBOW LEFT WO CONTRAST    Standing Status:   Future    Standing Expiration Date:   01/19/2024    Order Specific Question:   What is the patient's sedation requirement?    Answer:   No Sedation    Order Specific Question:   Does the patient have a pacemaker or implanted devices?    Answer:   No    Order Specific Question:   Preferred imaging location?    Answer:   [facility] (table limit-550lbs)   Ambulatory referral to Occupational Therapy    Referral Priority:   Routine    Referral Type:   Occupational Therapy    Referral Reason:   Specialty Services Required    Requested Specialty:   Occupational Therapy    Number of Visits Requested:   1   No orders of the defined types were placed in this encounter.    Discussed warning signs or symptoms. Please see discharge instructions. Patient expresses understanding.   The above documentation has been reviewed and is accurate and complete Clementeen Graham, M.D.

## 2023-01-20 NOTE — Therapy (Signed)
OUTPATIENT OCCUPATIONAL THERAPY ORTHO EVALUATION  Patient Name: Cameron Steele MRN: [id number] DOB:1975/12/03, 47 y.o., male Today's Date: 01/21/2023  PCP: Aliene Beams, MD REFERRING PROVIDER: Rodolph Bong, MD   END OF SESSION:  OT End of Session - 01/21/23 0807     Visit Number 1    Number of Visits 10    Date for OT Re-Evaluation 03/05/23    Authorization Type BC[employer]    OT Start Time 0807    OT Stop Time 0854    OT Time Calculation (min) 47 min    Activity Tolerance Patient tolerated treatment well;No increased pain;Patient limited by fatigue;Patient limited by pain    Behavior During Therapy [facility] for tasks assessed/performed             Past Medical History:  Diagnosis Date   Bipolar 1 disorder (HCC)    High cholesterol    Hypertension    OCD (obsessive compulsive disorder)    Past Surgical History:  Procedure Laterality Date   LYMPH NODE BIOPSY     inguinal, benign   Patient Active Problem List   Diagnosis Date Noted   Hepatitis 02/25/2022   Hyperlipidemia 02/25/2022   Hypertension 02/25/2022   Acute pharyngitis 11/20/2019   Acute pain of left wrist 10/04/2019    ONSET DATE: At least 2 months (September 2024)  REFERRING DIAG: W09.811 (ICD-10-CM) - Left elbow pain   THERAPY DIAG:  Pain in left elbow - Plan: Ot plan of care cert/re-cert  Muscle weakness (generalized) - Plan: Ot plan of care cert/re-cert  Stiffness of left wrist, not elsewhere classified - Plan: Ot plan of care cert/re-cert  Rationale for Evaluation and Treatment: Rehabilitation  SUBJECTIVE:   SUBJECTIVE STATEMENT: He has left medial elbow/forearm pain.  He states this started in Sept 2024, while lifting dumbells.  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    PERTINENT HISTORY: Per MD note: "He has had 6 weeks of physician directed conservative management including home exercise program. Unfortunately he is no better.  Plan for MRI to further characterize source of pain and for surgery or injection planning. "    PRECAUTIONS: None;  RED FLAGS: None   WEIGHT BEARING RESTRICTIONS: Yes recommended to not lift anything painful and more than 10 pounds with the left arm until otherwise told  PAIN:  Are you having pain? Yes: NPRS scale: now at rest 2-3/10 and at worst in the past week up to 7/10 Pain location: Lt medial Epi Pain description: Sharp with lifting or flexing the wrist or extending the elbow Aggravating factors: Lifting weight, wrist flexed Relieving factors: Rest and heat  FALLS: Has patient fallen in last 6 months? No  LIVING ENVIRONMENT: Lives with: lives with their family Lives in: House/apartment Has following equipment at home: None  PLOF: Independent  PATIENT GOALS: Reduce pain and increase ability with the nondominant left arm  NEXT MD VISIT: As needed   OBJECTIVE: (All objective assessments below are from initial evaluation on: 01/21/2023 unless otherwise specified.)   HAND DOMINANCE: Right   ADLs: Overall ADLs: States decreased ability to grab, hold household objects, pain and difficulty to open containers, any lifting with palm up or extended elbow   FUNCTIONAL OUTCOME MEASURES: Eval: Quick DASH 27% impairment today  (Higher % Score  =  More Impairment)    UPPER EXTREMITY ROM     Shoulder to Wrist AROM Right eval Left eval  Shoulder flexion  Shoulder abduction    Shoulder extension    Shoulder internal rotation    Shoulder external rotation    Elbow flexion  145  Elbow extension  0 tender  Forearm supination 70 75  Forearm pronation   92  Wrist flexion 76 73  Wrist extension 75 69  (Blank rows = not tested)   Hand AROM Left eval  Full Fist Ability (or Gap to Distal Palmar Crease) full  Thumb Opposition  (Kapandji Scale)  10/10  (Blank rows = not tested)   UPPER EXTREMITY MMT:     MMT Left 01/21/23  Elbow flexion 4-/5 tender  Elbow extension 4+/5  mildly tender  Forearm supination 5/5  Forearm pronation 4-/5 pain  Wrist flexion 4-/5 pain  Wrist extension 5/5  Wrist ulnar deviation   Wrist radial deviation   (Blank rows = not tested)  HAND FUNCTION: Eval: No pain or loss of strength of grasp bil.  Grip strength Right: 114 lbs, Left: 110 lbs   COORDINATION: Eval: No coordination issues   SENSATION: Eval: no sensation issues   EDEMA:   Eval: no edema   COGNITION: Eval: Overall cognitive status: WFL for evaluation today   OBSERVATIONS:   Eval: Classic signs of medial epicondylitis/golfer's elbow include tenderness to the medial epicondyle, pain with resisted wrist flexion, pain with resisted finger flexion, pain with elbow extension at endrange.  He also responds somewhat to counterforce brace that relieves pressure to the medial epicondyle.  It seems that he has not been consistent with a gentle nonpainful stretch routine and avoidance of weight lifting, as he continues to state painful weight lifting through the past several months.   TODAY'S TREATMENT:  Post-evaluation treatment:   He was given self-care/safety education to try to avoid provocative positions and postures (palm up lifting, wrist flexed lifting, elbow extended lifting), also to do no painful or heavy weightlifting until he has had no significant pain for at least 2 consecutive weeks.   He should use moist heat modality to the inner elbow for 5 minutes before stretching and exercising and also performed 2 to 3 minutes of manual self massage.  He was shown how to do these things.  He was also given the following home exercise program to complete at least 4 times a day, in a pain-free fashion.  Each of these were explained to him, demonstrated, and he demonstrates back with little to no pain and actually some pain relief.  Exercises - Median Nerve Flossing  - 2-3 x daily - 5-10 reps (neuromuscular reeducation for the course of the median nerve) - Fridge Door  Stretch  - 4 x daily - 3-5 reps - 15 sec hold - Forearm Supination Stretch  - 3-4 x daily - 3-5 reps - 15 sec hold - Wrist Extension Stretch Pronated  - 4 x daily - 3-5 reps - 15 hold - Pencil Pushup  - 4-6 x daily - 1 sets - 10-15 reps   PATIENT EDUCATION: Education details: See tx section above for details  Person educated: Patient Education method: Verbal Instruction, Teach back, Handouts  Education comprehension: States and demonstrates understanding, Additional Education required    HOME EXERCISE PROGRAM: Access Code: [email or web address] URL: [email or web address] Date: 01/21/2023 Prepared by: Fannie Knee   GOALS: Goals reviewed with patient? Yes   SHORT TERM GOALS: (STG required if POC>30 days) Target Date: 01/29/2023  Pt will demo/state understanding of initial HEP to improve pain levels and prerequisite motion. Goal status: INITIAL   LONG TERM  GOALS: Target Date: 03/05/2023  Pt will improve functional ability by decreased impairment per Quick DASH assessment from 27% to 10% or better, for better quality of life. Goal status: INITIAL  2.  Pt will improve A/ROM in left wrist flexion/extension from 73/69 to at least 76/75, to have functional motion for tasks like reach and grasp.  Goal status: INITIAL  3.  Pt will improve strength in left forearm pronation from painful 4 -/5 MMT to at least 4+/5 MMT to have increased functional ability to carry out selfcare and higher-level homecare tasks with less difficulty. Goal status: INITIAL  4.  Pt will decrease pain at worst from 7/10 to 3/10 or better to have better sleep and occupational participation in daily roles. Goal status: INITIAL   ASSESSMENT:  CLINICAL IMPRESSION: Patient is a 47y.o. male who was seen today for occupational therapy evaluation for painful sore and stiff left elbow and forearm commonly called medial epicondylitis/golfer's elbow.  This has been decreasing his ability to do his job as well  as home tasks and even some self-care.  He will benefit from outpatient occupational therapy to decrease negative symptoms and increase quality of life.   PERFORMANCE DEFICITS: in functional skills including ADLs, IADLs, ROM, strength, pain, fascial restrictions, muscle spasms, flexibility, body mechanics, cardiopulmonary status limiting function, decreased knowledge of precautions, and UE functional use, cognitive skills including problem solving and safety awareness, and psychosocial skills including coping strategies, environmental adaptation, and habits.   IMPAIRMENTS: are limiting patient from ADLs, IADLs, work, leisure, and social participation.   COMORBIDITIES: may have co-morbidities  that affects occupational performance. Patient will benefit from skilled OT to address above impairments and improve overall function.  MODIFICATION OR ASSISTANCE TO COMPLETE EVALUATION: No modification of tasks or assist necessary to complete an evaluation.  OT OCCUPATIONAL PROFILE AND HISTORY: Problem focused assessment: Including review of records relating to presenting problem.  CLINICAL DECISION MAKING: LOW - limited treatment options, no task modification necessary  REHAB POTENTIAL: Excellent  EVALUATION COMPLEXITY: Low      PLAN:  OT FREQUENCY: 1-2x/week  OT DURATION: 6 weeks through 03/05/2023 as needed and up to 10 total visits as needed  PLANNED INTERVENTIONS: 97168 OT Re-evaluation, 97535 self care/ADL training, 81191 therapeutic exercise, 97530 therapeutic activity, 97112 neuromuscular re-education, 97140 manual therapy, 97035 ultrasound, 97010 moist heat, 97010 cryotherapy, 97033 iontophoresis, 97760 Orthotics management and training, 47829 Splinting (initial encounter), [id number] Subsequent splinting/medication, compression bandaging, Dry needling, coping strategies training, and patient/family education  RECOMMENDED OTHER SERVICES: None now  CONSULTED AND AGREED WITH PLAN OF CARE:  Patient  PLAN FOR NEXT SESSION:   Review initial home exercise program, use modalities and manual therapy as helpful.  Encourage rest and reduction of pain for a solid 2 to 4-week period.   Fannie Knee, OTR/L, CHT 01/21/2023, 2:40 PM

## 2023-01-21 ENCOUNTER — Ambulatory Visit: Payer: BC Managed Care – PPO | Admitting: Rehabilitative and Restorative Service Providers"

## 2023-01-21 ENCOUNTER — Other Ambulatory Visit: Payer: Self-pay

## 2023-01-21 ENCOUNTER — Encounter: Payer: Self-pay | Admitting: Rehabilitative and Restorative Service Providers"

## 2023-01-21 DIAGNOSIS — M25632 Stiffness of left wrist, not elsewhere classified: Secondary | ICD-10-CM

## 2023-01-21 DIAGNOSIS — M25522 Pain in left elbow: Secondary | ICD-10-CM | POA: Diagnosis not present

## 2023-01-21 DIAGNOSIS — M6281 Muscle weakness (generalized): Secondary | ICD-10-CM | POA: Diagnosis not present

## 2023-01-26 NOTE — Therapy (Signed)
OUTPATIENT OCCUPATIONAL THERAPY TREATMENT NOTE  Patient Name: Cameron Steele MRN: [id number] DOB:05-May-1975, 47 y.o., male Today's Date: 01/28/2023  PCP: Aliene Beams, MD REFERRING PROVIDER: Rodolph Bong, MD   END OF SESSION:  OT End of Session - 01/28/23 0806     Visit Number 2    Number of Visits 10    Date for OT Re-Evaluation 03/05/23    Authorization Type [employer]    OT Start Time 0806    OT Stop Time 0841    OT Time Calculation (min) 35 min    Activity Tolerance Patient tolerated treatment well;No increased pain;Patient limited by fatigue;Patient limited by pain    Behavior During Therapy [facility] for tasks assessed/performed             Past Medical History:  Diagnosis Date   Bipolar 1 disorder (HCC)    High cholesterol    Hypertension    OCD (obsessive compulsive disorder)    Past Surgical History:  Procedure Laterality Date   LYMPH NODE BIOPSY     inguinal, benign   Patient Active Problem List   Diagnosis Date Noted   Hepatitis 02/25/2022   Hyperlipidemia 02/25/2022   Hypertension 02/25/2022   Acute pharyngitis 11/20/2019   Acute pain of left wrist 10/04/2019    ONSET DATE: At least 2 months (September 2024)  REFERRING DIAG: M25.522 (ICD-10-CM) - Left elbow pain   THERAPY DIAG:  Muscle weakness (generalized)  Pain in left elbow  Stiffness of left wrist, not elsewhere classified  Rationale for Evaluation and Treatment: Rehabilitation  PERTINENT HISTORY: Per MD note: "He has had 6 weeks of physician directed conservative management including home exercise program. Unfortunately he is no better. Plan for MRI to further characterize source of pain and for surgery or injection planning. "   He has left medial elbow/forearm pain.  He states this started in Sept 2024, while lifting dumbells.  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   PRECAUTIONS: None;  RED FLAGS: None   WEIGHT BEARING  RESTRICTIONS: Yes recommended to not lift anything painful and more than 10 pounds with the left arm until otherwise told   SUBJECTIVE:   SUBJECTIVE STATEMENT: He states having some increased pain, was using counterforce brace and it made him feel a bit "cocky" and he was working out and has a bit more pain today. Marland Kitchen    PAIN:  Are you having pain? Yes: NPRS scale: now at rest 6/10 Pain location: Lt medial Epi Pain description: Sharp with lifting or flexing the wrist or extending the elbow Aggravating factors: Lifting weight, wrist flexed Relieving factors: Rest and heat   PATIENT GOALS: Reduce pain and increase ability with the nondominant left arm  NEXT MD VISIT: As needed   OBJECTIVE: (All objective assessments below are from initial evaluation on: 01/21/2023 unless otherwise specified.)   HAND DOMINANCE: Right   ADLs: Overall ADLs: States decreased ability to grab, hold household objects, pain and difficulty to open containers, any lifting with palm up or extended elbow   FUNCTIONAL OUTCOME MEASURES: Eval: Quick DASH 27% impairment today  (Higher % Score  =  More Impairment)    UPPER EXTREMITY ROM     Shoulder to Wrist AROM Right eval Left eval  Shoulder flexion    Shoulder abduction    Shoulder extension    Shoulder internal rotation    Shoulder external rotation    Elbow flexion  145  Elbow extension  0 tender  Forearm supination 70 75  Forearm pronation   92  Wrist flexion 76 73  Wrist extension 75 69  (Blank rows = not tested)   Hand AROM Left eval  Full Fist Ability (or Gap to Distal Palmar Crease) full  Thumb Opposition  (Kapandji Scale)  10/10  (Blank rows = not tested)   UPPER EXTREMITY MMT:     MMT Left 01/21/23  Elbow flexion 4-/5 tender  Elbow extension 4+/5 mildly tender  Forearm supination 5/5  Forearm pronation 4-/5 pain  Wrist flexion 4-/5 pain  Wrist extension 5/5  Wrist ulnar deviation   Wrist radial deviation   (Blank rows =  not tested)  HAND FUNCTION: Eval: No pain or loss of strength of grasp bil.  Grip strength Right: 114 lbs, Left: 110 lbs   OBSERVATIONS:   Eval: Classic signs of medial epicondylitis/golfer's elbow include tenderness to the medial epicondyle, pain with resisted wrist flexion, pain with resisted finger flexion, pain with elbow extension at endrange.  He also responds somewhat to counterforce brace that relieves pressure to the medial epicondyle.  It seems that he has not been consistent with a gentle nonpainful stretch routine and avoidance of weight lifting, as he continues to state painful weight lifting through the past several months.   TODAY'S TREATMENT:  01/28/23: While he is on moist for about 3 to 5 minutes, OT verbally and visually reviews his home exercise program with him, emphasizing nonpainful stretches that are long-duration and light tension.  Some of the stretches are modified with time to find the ultimate stretch.  He states moist heat is somewhat helpful to loosen his muscles than he stands and performs the stretches back with some modifications.  For safety/self-care, again OT recommends not to do anything painful, no heavy lifting, wear his oppressive elbow strap if he does so-though if he is having any pain he should stop immediately and try to find at least 2 weeks of consecutive rest with very low pain (2-3 out of 10 or less).  Additionally, OT adds on new tricep stretch today which she feels a bit in the medial elbow as well as bolded below.  Lastly, OT performs manual therapy dry needling modality, after he gives his consent and after discussion of the possible benefits and negative effects.  OT uses a 0.30 x 40mm needle inserted only partly into 3 different areas in his volar medial forearm achieving a few excellent twitch responses, and a subsequent reduction of pain.  He has no bleeding or bruising or significant pain or stiffness after this treatment.  He leaves today stating  understanding his home exercise program, he will try to avoid painful activities, and that he feels better than when he walked in the door.  Exercises - Median Nerve Flossing  - 2-3 x daily - 5-10 reps - Fridge Door Stretch  - 4 x daily - 3-5 reps - 15 sec hold - Forearm Supination Stretch  - 3-4 x daily - 3-5 reps - 15 sec hold - Wrist Extension Stretch Pronated  - 4 x daily - 3-5 reps - 15 hold - Pencil Pushup  - 4-6 x daily - 1 sets - 10-15 reps - Tricep Stretch- DO SEATED BY TABLE  - 3-4 x daily - 3-5 reps - 15 hold   PATIENT EDUCATION: Education details: See tx section above for details  Person educated: Patient Education method: Verbal Instruction, Teach back, Handouts  Education comprehension: States  and demonstrates understanding, Additional Education required    HOME EXERCISE PROGRAM: Access Code: [email or web address] URL: [email or web address] Date: 01/21/2023 Prepared by: Fannie Knee   GOALS: Goals reviewed with patient? Yes   SHORT TERM GOALS: (STG required if POC>30 days) Target Date: 01/29/2023  Pt will demo/state understanding of initial HEP to improve pain levels and prerequisite motion. Goal status: 01/28/23: MET   LONG TERM GOALS: Target Date: 03/05/2023  Pt will improve functional ability by decreased impairment per Quick DASH assessment from 27% to 10% or better, for better quality of life. Goal status: INITIAL  2.  Pt will improve A/ROM in left wrist flexion/extension from 73/69 to at least 76/75, to have functional motion for tasks like reach and grasp.  Goal status: INITIAL  3.  Pt will improve strength in left forearm pronation from painful 4 -/5 MMT to at least 4+/5 MMT to have increased functional ability to carry out selfcare and higher-level homecare tasks with less difficulty. Goal status: INITIAL  4.  Pt will decrease pain at worst from 7/10 to 3/10 or better to have better sleep and occupational participation in daily roles. Goal  status: INITIAL   ASSESSMENT:  CLINICAL IMPRESSION: 01/28/23: He still been lifting weights and causing pain to himself which was not recommended.  OT did have a lengthy conversation with him today to avoid lifting weights or doing anything painful including "farmers carry" or any kind of grip training with the left arm now.  If he continues to do painful activities especially repetitively, he is not going to improve this tendinitis.  Consider working together twice a week if he is still has significant pain next week    PLAN:  OT FREQUENCY: 1-2x/week  OT DURATION: 6 weeks through 03/05/2023 as needed and up to 10 total visits as needed  PLANNED INTERVENTIONS: 97168 OT Re-evaluation, 97535 self care/ADL training, 54098 therapeutic exercise, 97530 therapeutic activity, 97112 neuromuscular re-education, 97140 manual therapy, 97035 ultrasound, 97010 moist heat, 97010 cryotherapy, 97033 iontophoresis, 97760 Orthotics management and training, 11914 Splinting (initial encounter), [id number] Subsequent splinting/medication, compression bandaging, Dry needling, coping strategies training, and patient/family education  CONSULTED AND AGREED WITH PLAN OF CARE: Patient  PLAN FOR NEXT SESSION:   Consider moving to 2 times a week therapy if he still having considerable pain in the next session.  Continue to focus on nonpainful activities, rest and nonpainful stretches.  After at least a 2-week consecutive period of low pain, start light light eccentric bicep training.   Fannie Knee, OTR/L, CHT 01/28/2023, 8:46 AM

## 2023-01-28 ENCOUNTER — Ambulatory Visit: Payer: BC Managed Care – PPO | Admitting: Rehabilitative and Restorative Service Providers"

## 2023-01-28 ENCOUNTER — Encounter: Payer: Self-pay | Admitting: Rehabilitative and Restorative Service Providers"

## 2023-01-28 DIAGNOSIS — M25522 Pain in left elbow: Secondary | ICD-10-CM

## 2023-01-28 DIAGNOSIS — M6281 Muscle weakness (generalized): Secondary | ICD-10-CM

## 2023-01-28 DIAGNOSIS — M25632 Stiffness of left wrist, not elsewhere classified: Secondary | ICD-10-CM

## 2023-02-04 ENCOUNTER — Encounter: Payer: BC Managed Care – PPO | Admitting: Rehabilitative and Restorative Service Providers"

## 2023-02-07 ENCOUNTER — Ambulatory Visit
Admission: RE | Admit: 2023-02-07 | Discharge: 2023-02-07 | Disposition: A | Discharge status: Home / Self Care | Payer: BC Managed Care – PPO | Source: Ambulatory Visit | Attending: Family Medicine | Admitting: Family Medicine

## 2023-02-07 DIAGNOSIS — M25522 Pain in left elbow: Secondary | ICD-10-CM

## 2023-02-07 DIAGNOSIS — M7712 Lateral epicondylitis, left elbow: Secondary | ICD-10-CM | POA: Diagnosis not present

## 2023-02-15 NOTE — Progress Notes (Signed)
Left elbow MRI shows mild tendinitis at the flexor side of the elbow with a small tear.  This is similar to golfers elbow.  This ought to get better with occupational therapy.  If not improved consider injection.  How are you feeling?

## 2023-02-16 NOTE — Therapy (Addendum)
 OUTPATIENT OCCUPATIONAL THERAPY TREATMENT & DISCHARGE  NOTE  Patient Name: Cameron Steele MRN: [id number] DOB:19-Apr-1975, 47 y.o., male Today's Date: 02/18/2023  PCP: Rolinda Millman, MD REFERRING PROVIDER: Joane Artist RAMAN, MD               OCCUPATIONAL THERAPY DISCHARGE SUMMARY  Visits from Start of Care: 3  Unfortunately he stopped showing up to therapy.  He was educated to stop doing heavy and painful weight lifting, as he states he continues to do painful weight lifting that hurts him.  In fact he has never stopped weightlifting even when the doctor was treating him with his plan and exercise program.  OT did warn him that if he continues painful exacerbating activities that hurt, he may need a surgery in the future or per Happ's experience a tendon tear or worse.  He will be discharged today as he stopped showing up to therapy and had several no-shows and cancellations.   Melvenia Ada, OTR/L, CHT 03/12/23           END OF SESSION:  OT End of Session - 02/18/23 0805     Visit Number 3    Number of Visits 10    Date for OT Re-Evaluation 03/05/23    Authorization Type [employer]    OT Start Time 0805    OT Stop Time 0841    OT Time Calculation (min) 36 min    Equipment Utilized During Treatment [id number]    Activity Tolerance Patient tolerated treatment well;No increased pain;Patient limited by fatigue;Patient limited by pain    Behavior During Therapy [facility] for tasks assessed/performed              Past Medical History:  Diagnosis Date   Bipolar 1 disorder (HCC)    High cholesterol    Hypertension    OCD (obsessive compulsive disorder)    Past Surgical History:  Procedure Laterality Date   LYMPH NODE BIOPSY     inguinal, benign   Patient Active Problem List   Diagnosis Date Noted   Hepatitis 02/25/2022   Hyperlipidemia 02/25/2022   Hypertension 02/25/2022   Acute pharyngitis 11/20/2019   Acute pain of left wrist 10/04/2019    ONSET DATE: At least 2  months (September 2024)  REFERRING DIAG: M25.522 (ICD-10-CM) - Left elbow pain   THERAPY DIAG:  Muscle weakness (generalized)  Pain in left elbow  Stiffness of left wrist, not elsewhere classified  Rationale for Evaluation and Treatment: Rehabilitation  PERTINENT HISTORY: Per MD note: He has had 6 weeks of physician directed conservative management including home exercise program. Unfortunately he is no better. Plan for MRI to further characterize source of pain and for surgery or injection planning.    He has left medial elbow/forearm pain.  He states this started in Sept 2024, while lifting dumbells.  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   PRECAUTIONS: None;  RED FLAGS: None   WEIGHT BEARING RESTRICTIONS: Yes recommended to not lift anything painful and more than 10 pounds with the left arm until otherwise told   SUBJECTIVE:   SUBJECTIVE STATEMENT: He states pain has been fairly high still-he is at a 6/10 now.  He has continued to lift weights but is trying to avoid things that fully extend the elbow and flex the wrist.  Due to the holidays he has not been seen for at least 2 weeks   PAIN:  Are you having  pain? Yes: NPRS scale: now at rest 6/10 Pain location: Lt medial Epi Pain description: Sharp with lifting or flexing the wrist or extending the elbow Aggravating factors: Lifting weight, wrist flexed Relieving factors: Rest and heat   PATIENT GOALS: Reduce pain and increase ability with the nondominant left arm  NEXT MD VISIT: As needed   OBJECTIVE: (All objective assessments below are from initial evaluation on: 01/21/2023 unless otherwise specified.)   HAND DOMINANCE: Right   ADLs: Overall ADLs: States decreased ability to grab, hold household objects, pain and difficulty to open containers, any lifting with palm up or extended elbow   FUNCTIONAL OUTCOME MEASURES: Eval: Quick DASH 27%  impairment today  (Higher % Score  =  More Impairment)    UPPER EXTREMITY ROM     Shoulder to Wrist AROM Right eval Left eval Lt 02/18/23  Shoulder flexion     Shoulder abduction     Shoulder extension     Shoulder internal rotation     Shoulder external rotation     Elbow flexion  145   Elbow extension  0 tender   Forearm supination 70 75 90  Forearm pronation   92   Wrist flexion 76 73 82  Wrist extension 75 69 69  (Blank rows = not tested)   Hand AROM Left eval  Full Fist Ability (or Gap to Distal Palmar Crease) full  Thumb Opposition  (Kapandji Scale)  10/10  (Blank rows = not tested)   UPPER EXTREMITY MMT:     MMT Left 01/21/23  Elbow flexion 4-/5 tender  Elbow extension 4+/5 mildly tender  Forearm supination 5/5  Forearm pronation 4-/5 pain  Wrist flexion 4-/5 pain  Wrist extension 5/5  Wrist ulnar deviation   Wrist radial deviation   (Blank rows = not tested)  HAND FUNCTION: Eval: No pain or loss of strength of grasp bil.  Grip strength Right: 114 lbs, Left: 110 lbs   OBSERVATIONS:   Eval: Classic signs of medial epicondylitis/golfer's elbow include tenderness to the medial epicondyle, pain with resisted wrist flexion, pain with resisted finger flexion, pain with elbow extension at endrange.  He also responds somewhat to counterforce brace that relieves pressure to the medial epicondyle.  It seems that he has not been consistent with a gentle nonpainful stretch routine and avoidance of weight lifting, as he continues to state painful weight lifting through the past several months.   TODAY'S TREATMENT:  02/18/23: He starts with active range of motion which shows some mild improvements in motion but not in wrist extension which is a key indicator.  OT discusses with him avoiding any lifting more than 5 pounds at this point in order to put the brakes on and help heal up this tendinosis and small tear.  OT does manual therapy IASTM to the bicep, tricep, medial  elbow area before applying Kinesiotape and a supportive way to help limit full elbow extension and help assist with pronation.  He states this feels at least mildly supportive.  We also discussed him coming in twice a week for therapy so that we can be a bit more proactive and OT can help manage his symptoms better.  Next we reviewed his full home exercise program as listed below with which she does well and pain is reduced to 4/10.  Lastly, OT does introduce very light eccentric strengthening for the bicep with a 3 pound weight which is done approximately 8-10 times today with no increase in pain and is more  of an active stretch.  He is told to add this into his routine and be doing only stretches on painfully and proactively at least 3-4 times a day.  He can use ice at home if he gets soreness after the session.  He states understanding and leaves feeling happy   Exercises - Median Nerve Flossing  - 2-3 x daily - 5-10 reps - Tricep Stretch- DO SEATED BY TABLE  - 3-4 x daily - 3-5 reps - 15 hold - Fridge Door Stretch  - 4 x daily - 3-5 reps - 15 sec hold - Forearm Supination Stretch  - 3-4 x daily - 3-5 reps - 15 sec hold - Wrist Extension Stretch Pronated  - 4 x daily - 3-5 reps - 15 hold - Pencil Pushup  - 4-6 x daily - 1 sets - 10-15 reps -Eccentric Bicep strength 3# x8-10 reps    PATIENT EDUCATION: Education details: See tx section above for details  Person educated: Patient Education method: Verbal Instruction, Teach back, Handouts  Education comprehension: States and demonstrates understanding, Additional Education required    HOME EXERCISE PROGRAM: Access Code: [email or web address] URL: [email or web address] Date: 01/21/2023 Prepared by: Melvenia Ada   GOALS: Goals reviewed with patient? Yes   SHORT TERM GOALS: (STG required if POC>30 days) Target Date: 01/29/2023  Pt will demo/state understanding of initial HEP to improve pain levels and prerequisite motion. Goal  status: 01/28/23: MET   LONG TERM GOALS: Target Date: 03/05/2023  Pt will improve functional ability by decreased impairment per Quick DASH assessment from 27% to 10% or better, for better quality of life. Goal status: INITIAL  2.  Pt will improve A/ROM in left wrist flexion/extension from 73/69 to at least 76/75, to have functional motion for tasks like reach and grasp.  Goal status: INITIAL  3.  Pt will improve strength in left forearm pronation from painful 4 -/5 MMT to at least 4+/5 MMT to have increased functional ability to carry out selfcare and higher-level homecare tasks with less difficulty. Goal status: INITIAL  4.  Pt will decrease pain at worst from 7/10 to 3/10 or better to have better sleep and occupational participation in daily roles. Goal status: INITIAL   ASSESSMENT:  CLINICAL IMPRESSION: 02/18/23: As he has continued relatively moderate to high pain, we will see him more in therapy now focus more on manual therapy and modalities and also try ultrasound next session.  Continue on  01/28/23: He still been lifting weights and causing pain to himself which was not recommended.  OT did have a lengthy conversation with him today to avoid lifting weights or doing anything painful including farmers carry or any kind of grip training with the left arm now.  If he continues to do painful activities especially repetitively, he is not going to improve this tendinitis.  Consider working together twice a week if he is still has significant pain next week    PLAN:  OT FREQUENCY: 1-2x/week  OT DURATION: 6 weeks through 03/05/2023 as needed and up to 10 total visits as needed  PLANNED INTERVENTIONS: 97168 OT Re-evaluation, 97535 self care/ADL training, 02889 therapeutic exercise, 97530 therapeutic activity, 97112 neuromuscular re-education, 97140 manual therapy, 97035 ultrasound, 97010 moist heat, 97010 cryotherapy, 97033 iontophoresis, 97760 Orthotics management and training,  97760 Splinting (initial encounter), [id number] Subsequent splinting/medication, compression bandaging, Dry needling, coping strategies training, and patient/family education  CONSULTED AND AGREED WITH PLAN OF CARE: Patient  PLAN FOR NEXT SESSION:   Discharge   Melvenia Ada,  OTR/L, CHT 02/18/2023, 8:46 AM

## 2023-02-18 ENCOUNTER — Encounter: Payer: Self-pay | Admitting: Rehabilitative and Restorative Service Providers"

## 2023-02-18 ENCOUNTER — Ambulatory Visit: Payer: BC Managed Care – PPO | Admitting: Rehabilitative and Restorative Service Providers"

## 2023-02-18 ENCOUNTER — Encounter: Payer: Self-pay | Admitting: Family Medicine

## 2023-02-18 DIAGNOSIS — M25522 Pain in left elbow: Secondary | ICD-10-CM

## 2023-02-18 DIAGNOSIS — M6281 Muscle weakness (generalized): Secondary | ICD-10-CM

## 2023-02-18 DIAGNOSIS — M25632 Stiffness of left wrist, not elsewhere classified: Secondary | ICD-10-CM

## 2023-02-19 IMAGING — CR DG CHEST 2V
2 series · 2 of 2 positions shown · non-contrast
Comparison: Chest radiograph dated 11/04/2019.

CLINICAL DATA: Abnormal chest radiograph.

EXAM:
CHEST - 2 VIEW

[chest pa]
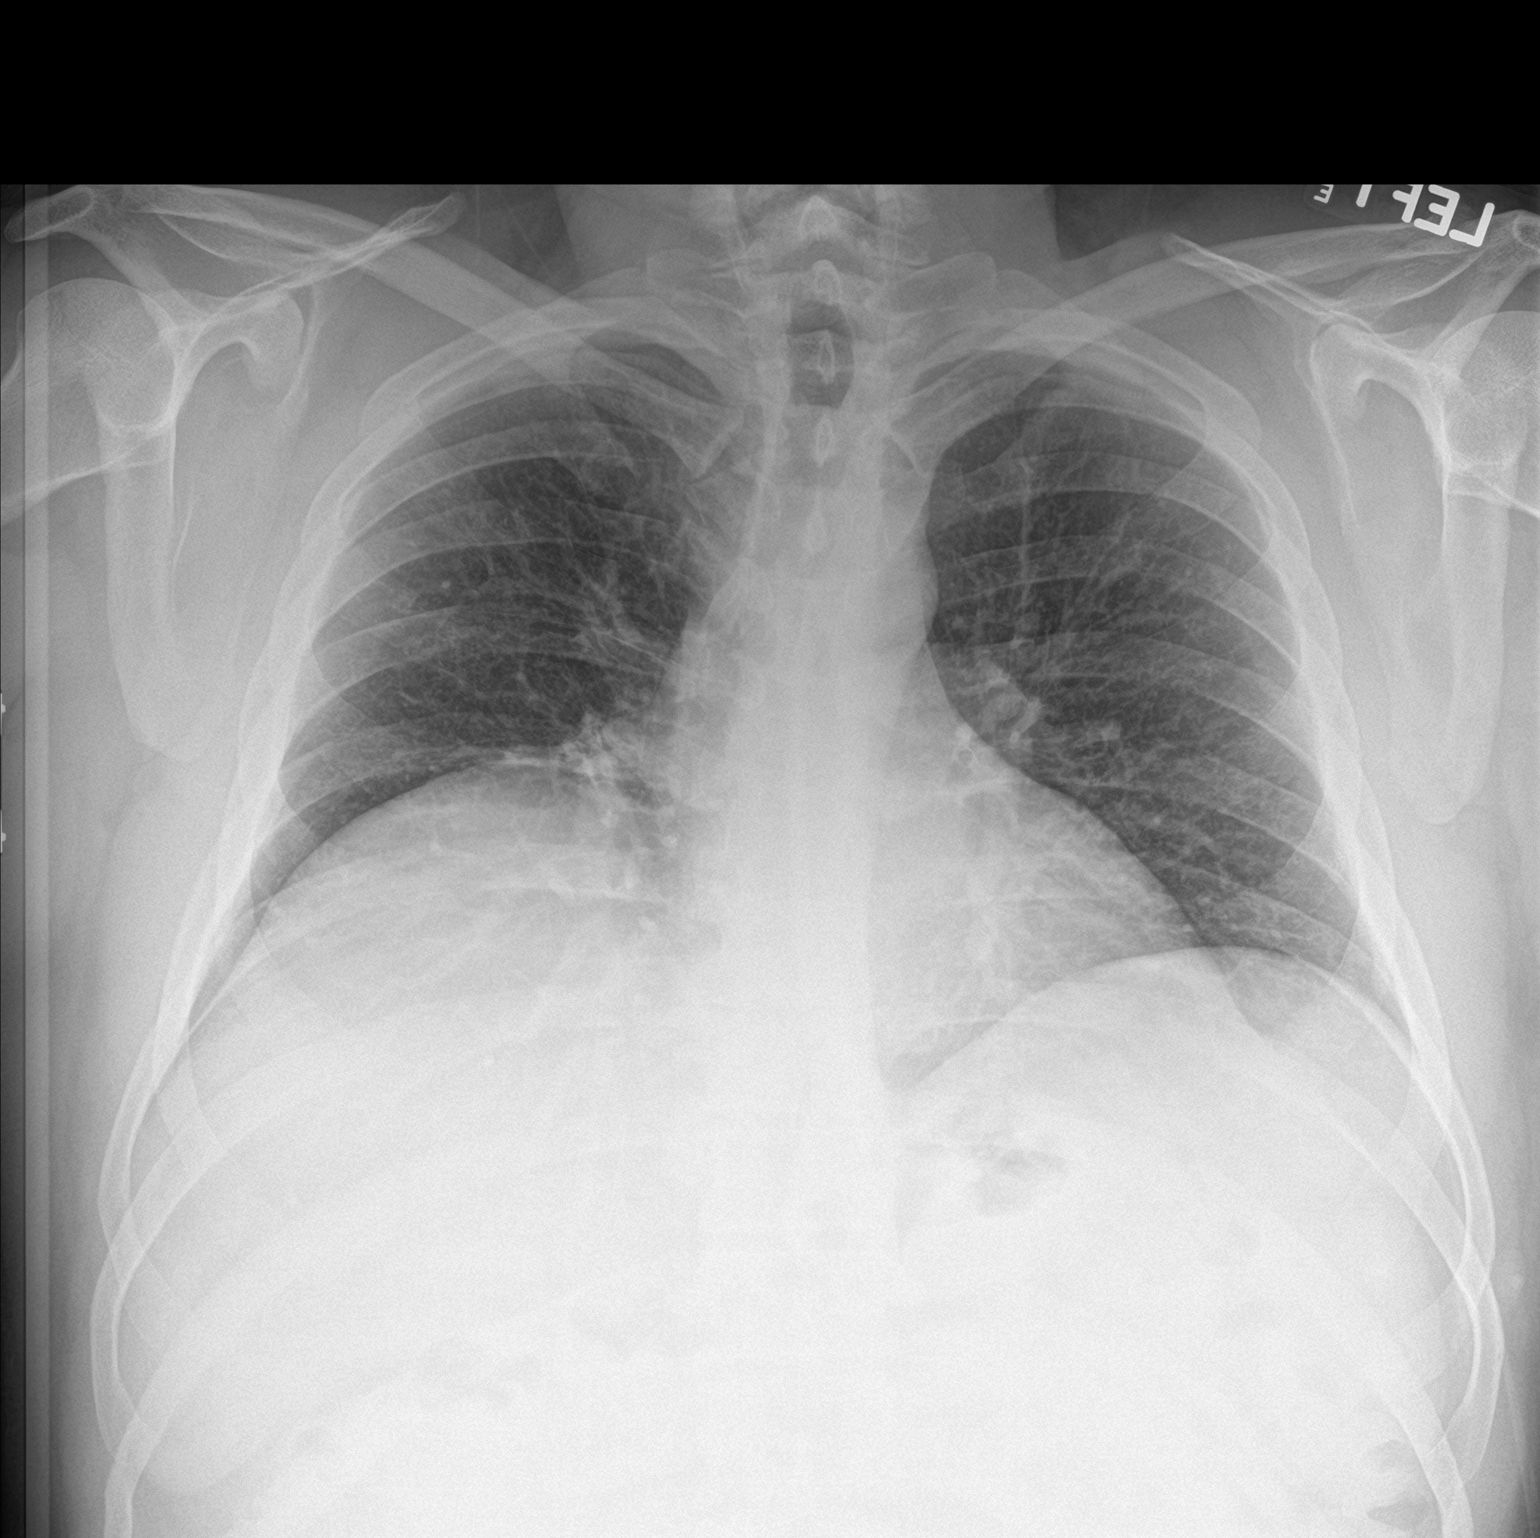

[chest lat]
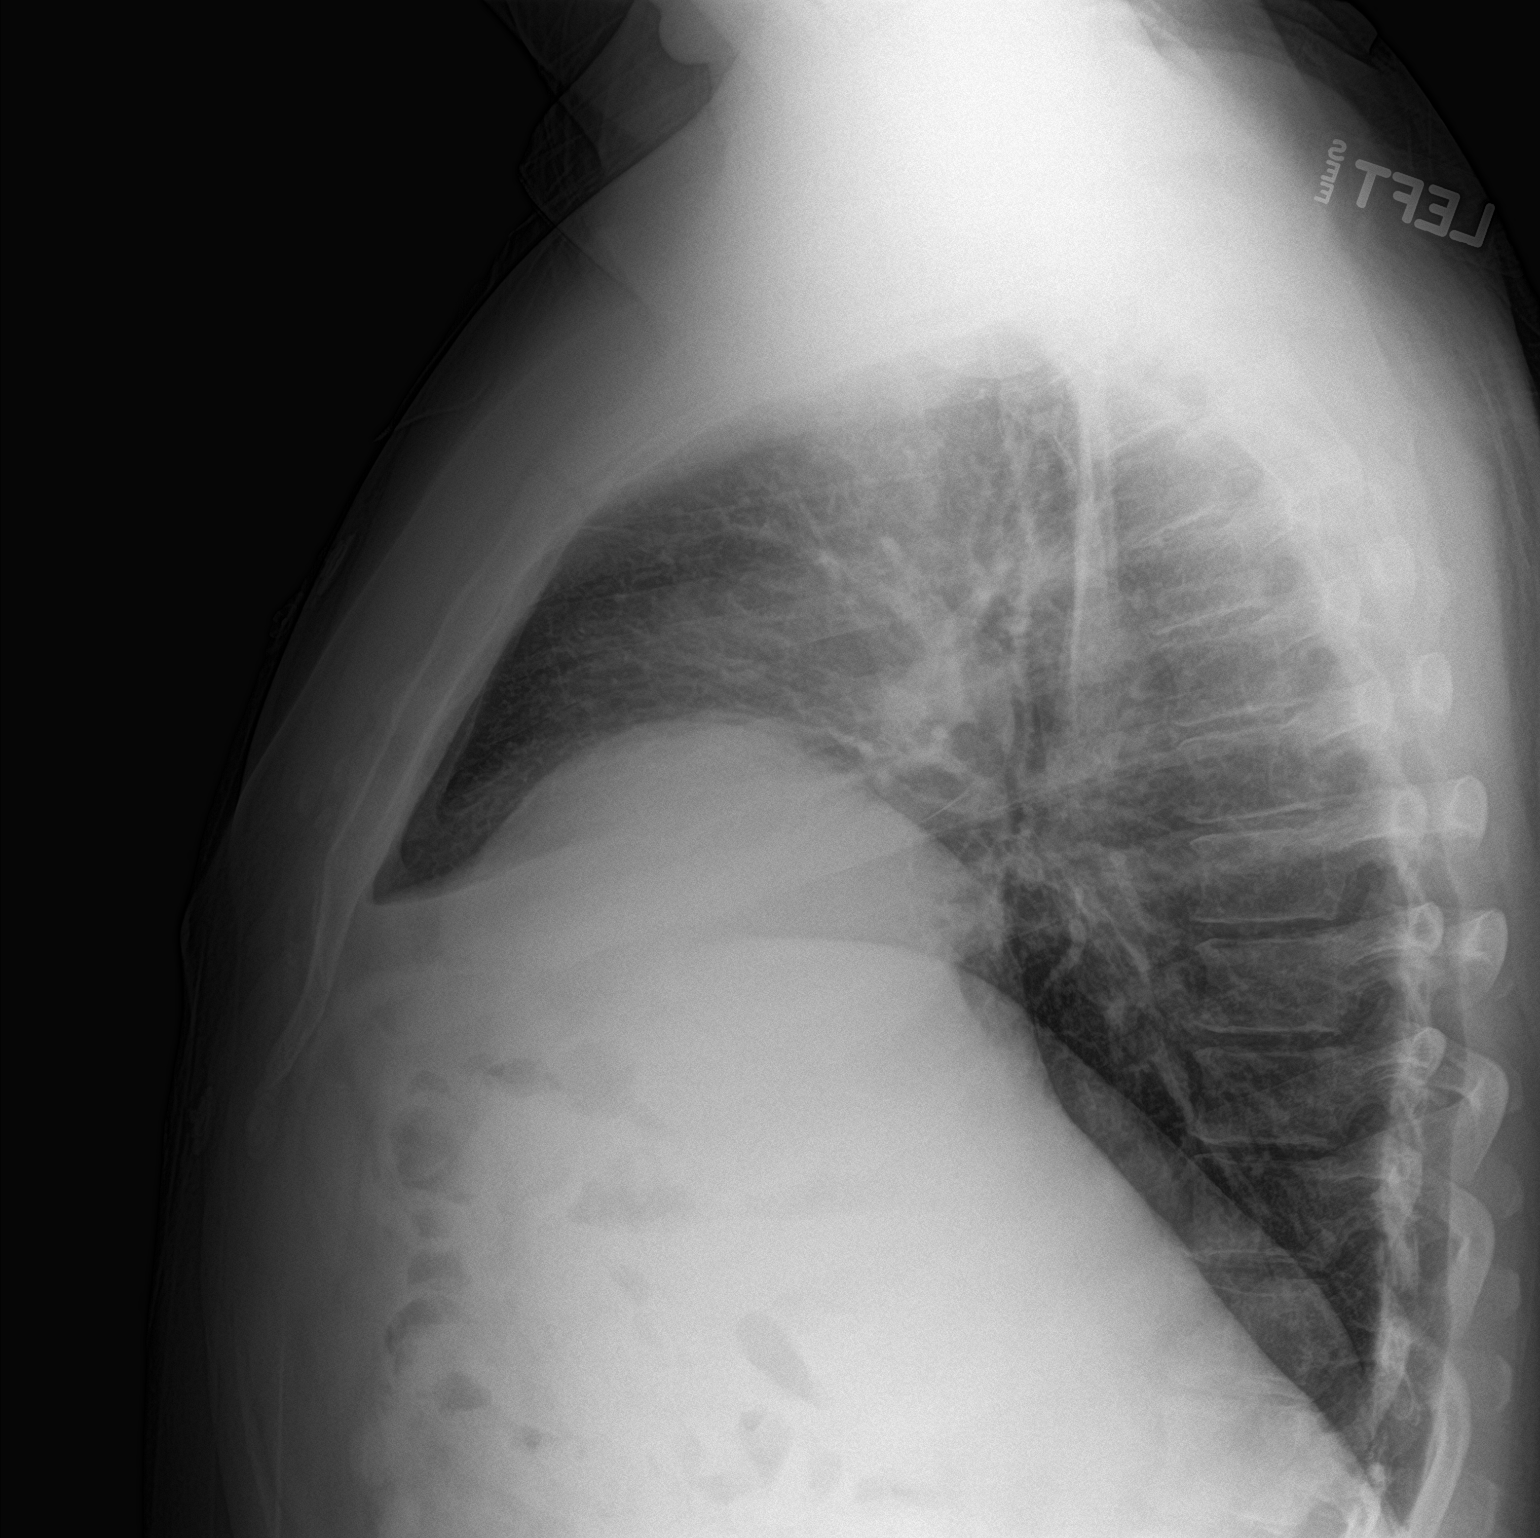

[2 of 2 positions shown; findings below may reference images not displayed]

FINDINGS: Mild eventration of the right hemidiaphragm. No focal consolidation,
pleural effusion, pneumothorax. The cardiac silhouette is within
limits. No acute osseous pathology.
IMPRESSION: No active cardiopulmonary disease.

## 2023-02-19 NOTE — Telephone Encounter (Signed)
 Yes I think you have the potential to heal this and get better. And I use speech recognition software and get words mixed up all the time. It happens.   Clayburn Pert

## 2023-02-22 ENCOUNTER — Encounter: Payer: BC Managed Care – PPO | Admitting: Rehabilitative and Restorative Service Providers"

## 2023-02-22 ENCOUNTER — Encounter: Payer: Self-pay | Admitting: Physical Therapy

## 2023-02-25 ENCOUNTER — Encounter: Payer: BC Managed Care – PPO | Admitting: Rehabilitative and Restorative Service Providers"

## 2023-02-25 ENCOUNTER — Telehealth: Payer: Self-pay | Admitting: Rehabilitative and Restorative Service Providers"

## 2023-02-25 NOTE — Telephone Encounter (Signed)
 OT called patient to discuss missed appointment. OT left message with pt about next appointment date/time. He was reminded of the attendance policy and future missed appointments could lead to early discharge from therapy.

## 2023-03-01 ENCOUNTER — Telehealth: Payer: Self-pay | Admitting: Rehabilitative and Restorative Service Providers"

## 2023-03-01 ENCOUNTER — Encounter: Payer: BC Managed Care – PPO | Admitting: Rehabilitative and Restorative Service Providers"

## 2023-03-01 NOTE — Telephone Encounter (Signed)
 OT called patient to discuss 2 no-shows in a row. Today 03/01/23, wasa no-show as well.) OT left message with pt that his appts have been cancelled and e should call back to reschedule 1-at -a-time, if future therapy is desired.

## 2023-03-02 DIAGNOSIS — J029 Acute pharyngitis, unspecified: Secondary | ICD-10-CM | POA: Diagnosis not present

## 2023-03-04 ENCOUNTER — Encounter: Payer: BC Managed Care – PPO | Admitting: Rehabilitative and Restorative Service Providers"

## 2023-03-05 IMAGING — US US ABDOMEN LIMITED
1 series · 14 of 25 positions shown · non-contrast
Comparison: None.

CLINICAL DATA: Cholelithiasis

EXAM:
ULTRASOUND ABDOMEN LIMITED RIGHT UPPER QUADRANT

[Series 1: us abdomen limited ruq (liver/gb) · 14 of 64 slices shown]
[im 1/64]
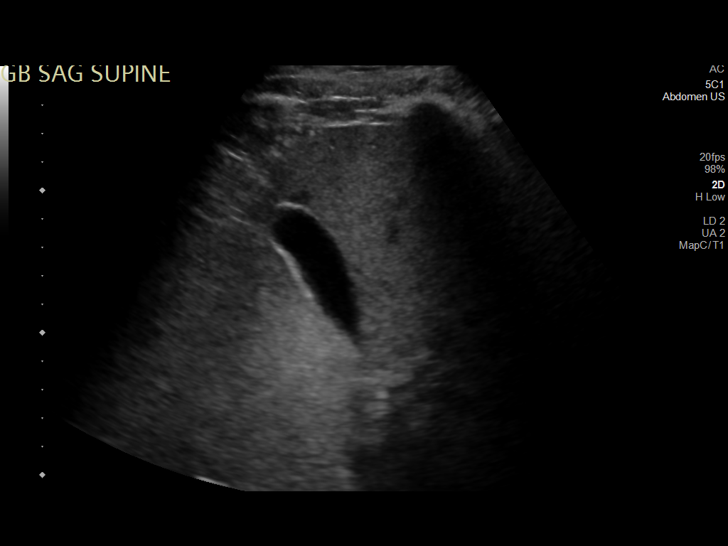
[im 6/64]
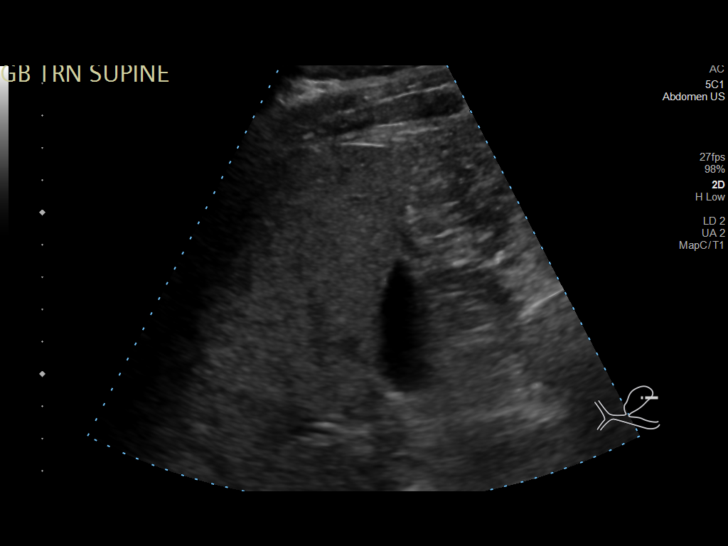
[im 11/64]
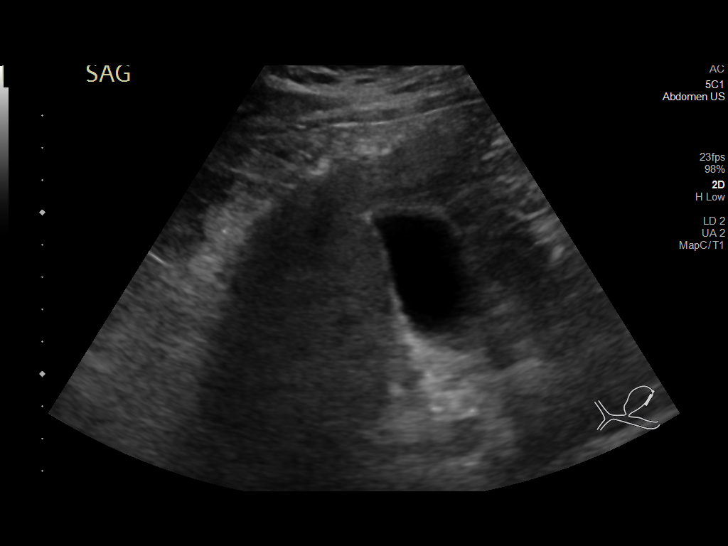
[im 16/64]
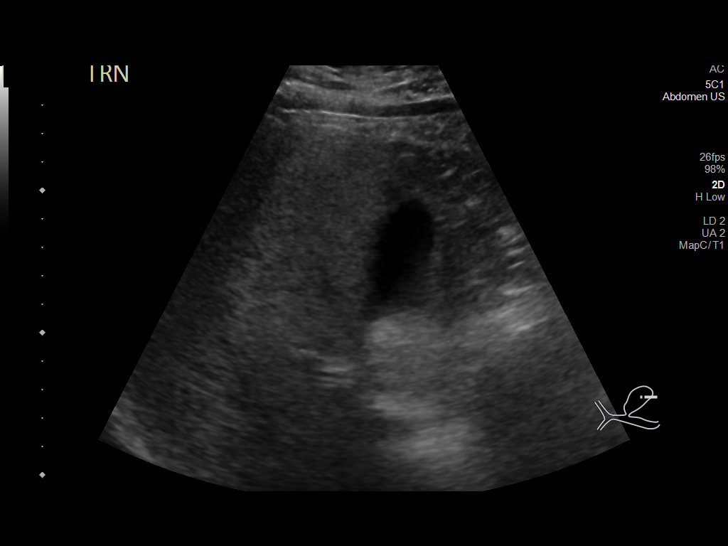
[im 22/64]
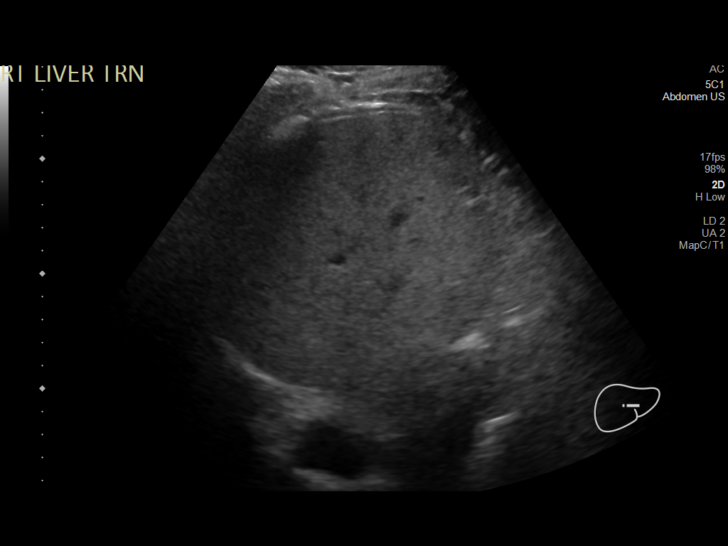
[im 24/64]
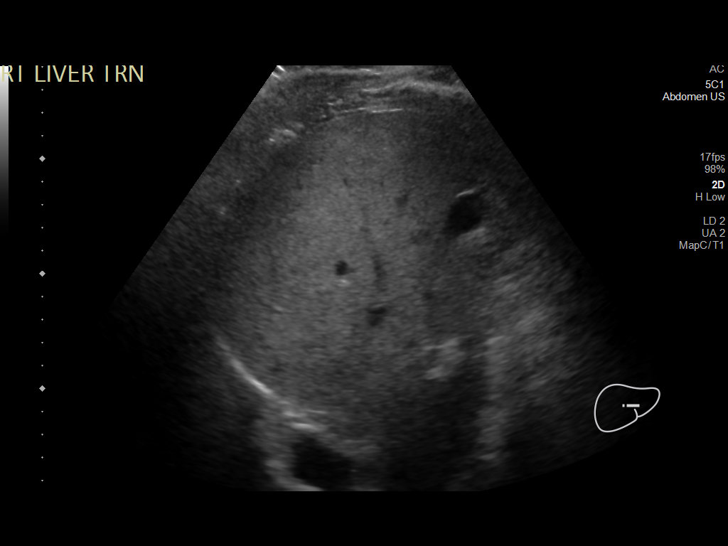
[im 29/64]
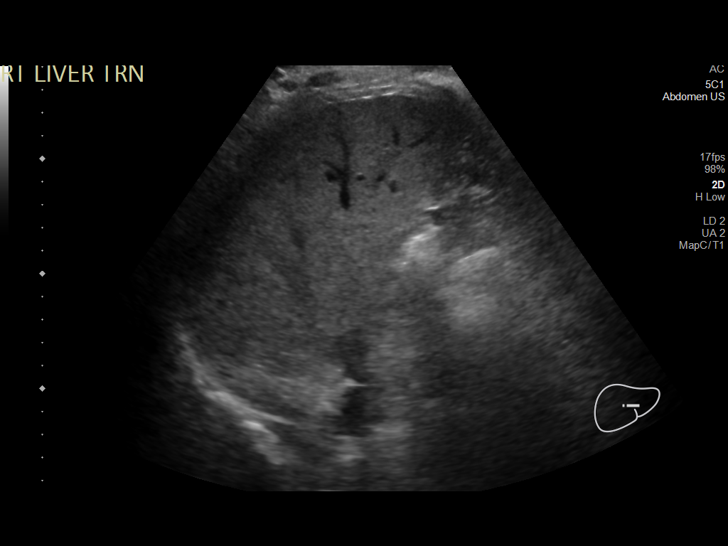
[im 35/64]
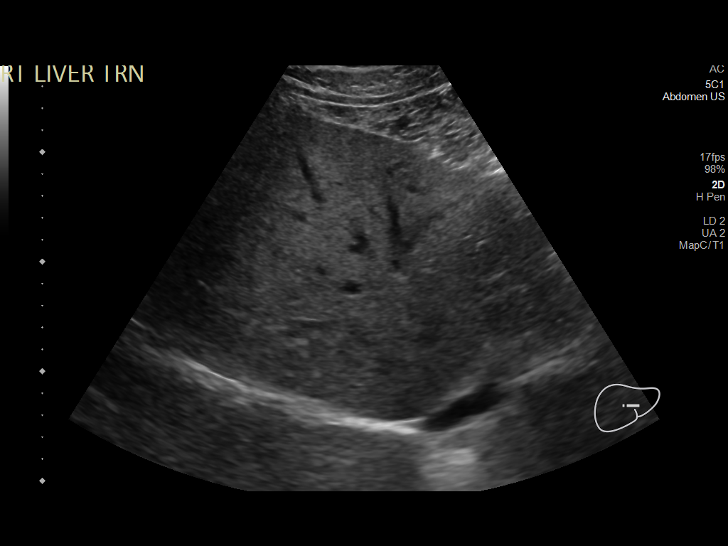
[im 40/64]
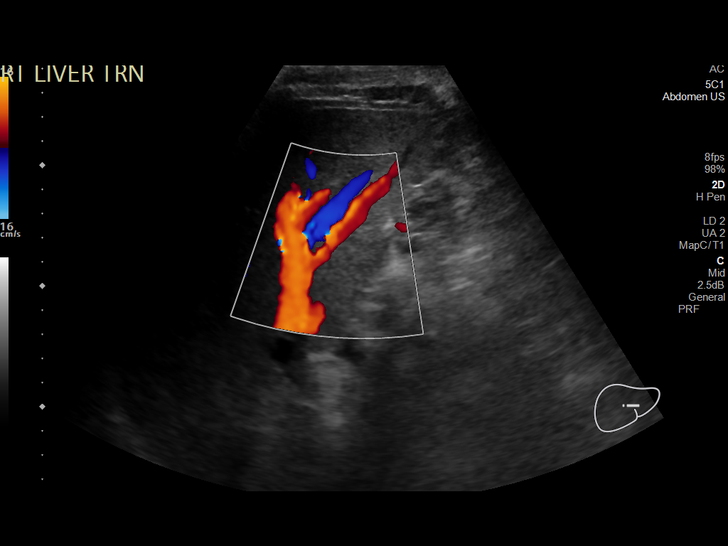
[im 43/64]
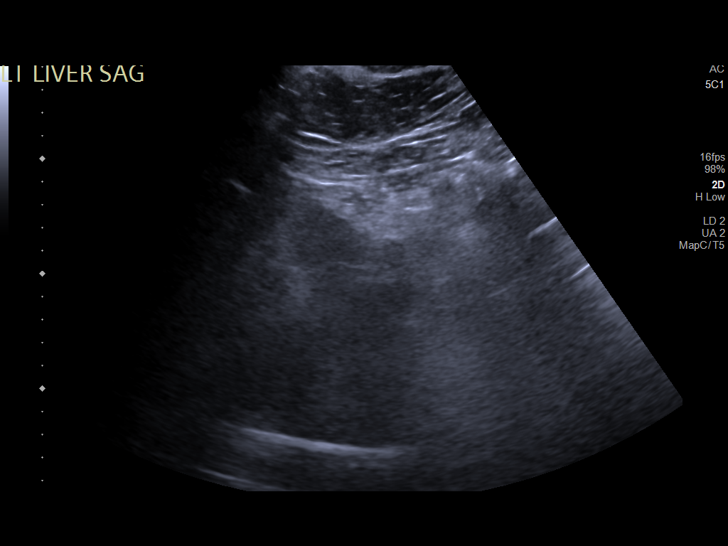
[im 48/64]
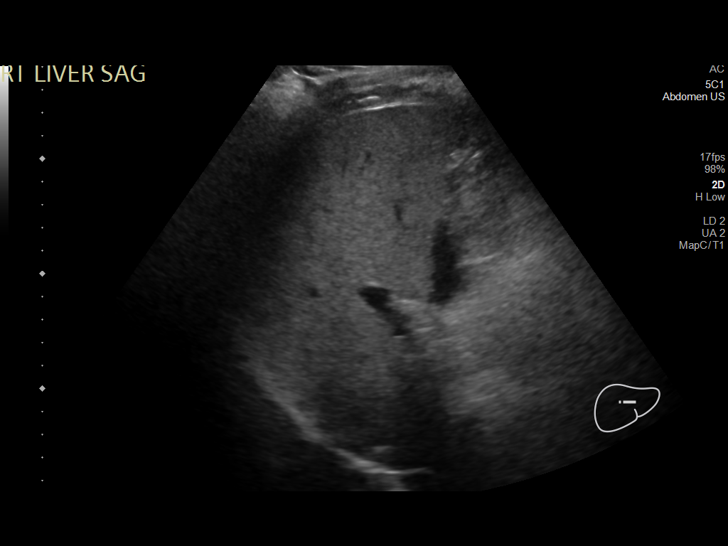
[im 53/64]
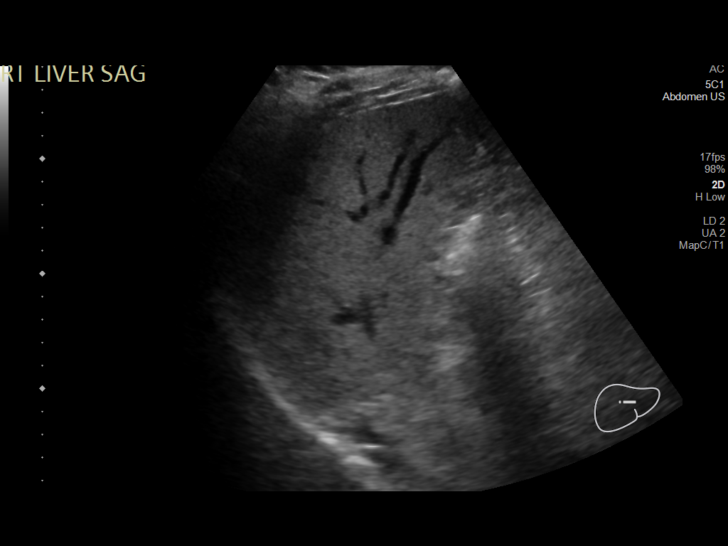
[im 58/64]
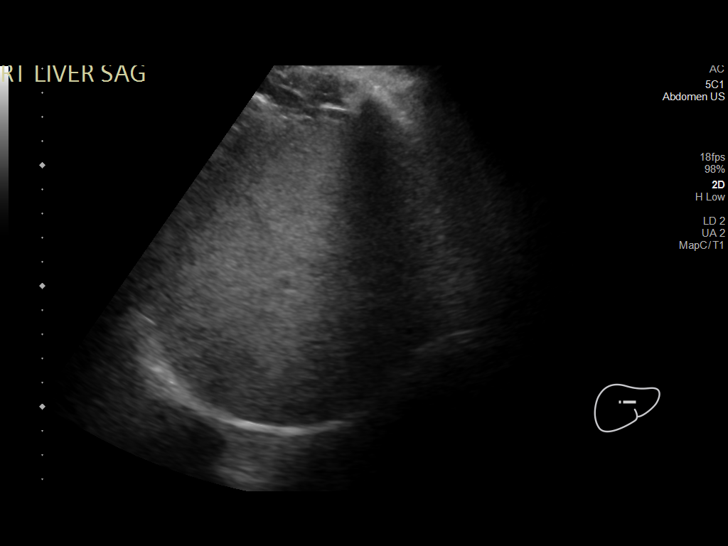
[im 64/64]
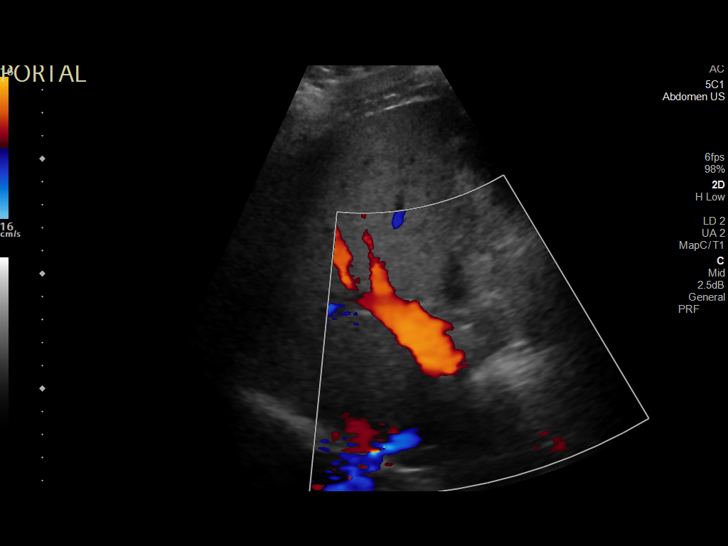

[14 of 25 positions shown; findings below may reference images not displayed]

FINDINGS: Gallbladder:

No gallstones or wall thickening visualized. No sonographic Murphy
sign noted by sonographer.

Common bile duct:

Diameter: 5 mm.

Liver:

No focal lesion identified. Increased in parenchymal echogenicity.
Portal vein is patent on color Doppler imaging with normal direction
of blood flow towards the liver.

Other: Slightly limited evaluation due to bowel gas.
IMPRESSION: Hepatic steatosis. Please note limited evaluation for focal hepatic
masses in a patient with hepatic steatosis due to decreased
penetration of the acoustic ultrasound waves.

## 2023-03-08 ENCOUNTER — Encounter: Payer: BC Managed Care – PPO | Admitting: Rehabilitative and Restorative Service Providers"

## 2023-03-11 ENCOUNTER — Encounter: Payer: BC Managed Care – PPO | Admitting: Rehabilitative and Restorative Service Providers"

## 2023-03-11 DIAGNOSIS — J329 Chronic sinusitis, unspecified: Secondary | ICD-10-CM | POA: Diagnosis not present

## 2023-03-11 DIAGNOSIS — Z125 Encounter for screening for malignant neoplasm of prostate: Secondary | ICD-10-CM | POA: Diagnosis not present

## 2023-03-11 DIAGNOSIS — Z0000 Encounter for general adult medical examination without abnormal findings: Secondary | ICD-10-CM | POA: Diagnosis not present

## 2023-03-11 DIAGNOSIS — N529 Male erectile dysfunction, unspecified: Secondary | ICD-10-CM | POA: Diagnosis not present

## 2023-03-11 DIAGNOSIS — Z5181 Encounter for therapeutic drug level monitoring: Secondary | ICD-10-CM | POA: Diagnosis not present

## 2023-03-11 DIAGNOSIS — D509 Iron deficiency anemia, unspecified: Secondary | ICD-10-CM | POA: Diagnosis not present

## 2023-03-11 DIAGNOSIS — I10 Essential (primary) hypertension: Secondary | ICD-10-CM | POA: Diagnosis not present

## 2023-03-11 DIAGNOSIS — E7800 Pure hypercholesterolemia, unspecified: Secondary | ICD-10-CM | POA: Diagnosis not present

## 2023-03-15 ENCOUNTER — Encounter: Payer: BC Managed Care – PPO | Admitting: Rehabilitative and Restorative Service Providers"

## 2023-03-18 ENCOUNTER — Encounter: Payer: BC Managed Care – PPO | Admitting: Rehabilitative and Restorative Service Providers"

## 2023-03-19 DIAGNOSIS — Z1211 Encounter for screening for malignant neoplasm of colon: Secondary | ICD-10-CM | POA: Diagnosis not present

## 2023-03-19 DIAGNOSIS — Z1212 Encounter for screening for malignant neoplasm of rectum: Secondary | ICD-10-CM | POA: Diagnosis not present

## 2023-03-23 ENCOUNTER — Encounter: Payer: Self-pay | Admitting: Family Medicine

## 2023-03-23 DIAGNOSIS — E0590 Thyrotoxicosis, unspecified without thyrotoxic crisis or storm: Secondary | ICD-10-CM | POA: Diagnosis not present

## 2023-03-23 DIAGNOSIS — R053 Chronic cough: Secondary | ICD-10-CM | POA: Diagnosis not present

## 2023-03-23 DIAGNOSIS — J329 Chronic sinusitis, unspecified: Secondary | ICD-10-CM | POA: Diagnosis not present

## 2023-03-24 ENCOUNTER — Other Ambulatory Visit: Payer: Self-pay

## 2023-03-24 DIAGNOSIS — M25522 Pain in left elbow: Secondary | ICD-10-CM

## 2023-04-05 ENCOUNTER — Other Ambulatory Visit: Payer: Self-pay | Admitting: Physician Assistant

## 2023-04-05 DIAGNOSIS — R7989 Other specified abnormal findings of blood chemistry: Secondary | ICD-10-CM

## 2023-04-07 ENCOUNTER — Encounter: Payer: Self-pay | Admitting: Internal Medicine

## 2023-04-20 ENCOUNTER — Ambulatory Visit
Admission: RE | Admit: 2023-04-20 | Discharge: 2023-04-20 | Disposition: A | Discharge status: Home / Self Care | Source: Ambulatory Visit | Attending: Physician Assistant | Admitting: Physician Assistant

## 2023-04-20 DIAGNOSIS — E0590 Thyrotoxicosis, unspecified without thyrotoxic crisis or storm: Secondary | ICD-10-CM | POA: Diagnosis not present

## 2023-04-20 DIAGNOSIS — R7989 Other specified abnormal findings of blood chemistry: Secondary | ICD-10-CM

## 2023-05-13 ENCOUNTER — Other Ambulatory Visit (HOSPITAL_COMMUNITY): Payer: Self-pay | Admitting: Family Medicine

## 2023-05-13 ENCOUNTER — Ambulatory Visit (HOSPITAL_COMMUNITY)
Admission: RE | Admit: 2023-05-13 | Discharge: 2023-05-13 | Disposition: A | Discharge status: Home / Self Care | Source: Ambulatory Visit | Attending: Family Medicine | Admitting: Family Medicine

## 2023-05-13 DIAGNOSIS — R079 Chest pain, unspecified: Secondary | ICD-10-CM | POA: Diagnosis not present

## 2023-05-13 DIAGNOSIS — R0602 Shortness of breath: Secondary | ICD-10-CM

## 2023-05-13 DIAGNOSIS — I10 Essential (primary) hypertension: Secondary | ICD-10-CM | POA: Diagnosis not present

## 2023-05-13 DIAGNOSIS — R053 Chronic cough: Secondary | ICD-10-CM | POA: Diagnosis not present

## 2023-05-13 DIAGNOSIS — E7800 Pure hypercholesterolemia, unspecified: Secondary | ICD-10-CM | POA: Diagnosis not present

## 2023-05-13 DIAGNOSIS — E0590 Thyrotoxicosis, unspecified without thyrotoxic crisis or storm: Secondary | ICD-10-CM | POA: Diagnosis not present

## 2023-05-13 DIAGNOSIS — R7989 Other specified abnormal findings of blood chemistry: Secondary | ICD-10-CM | POA: Insufficient documentation

## 2023-05-13 DIAGNOSIS — J9811 Atelectasis: Secondary | ICD-10-CM | POA: Diagnosis not present

## 2023-05-13 MED ORDER — IOHEXOL 350 MG/ML SOLN
75.0000 mL | Freq: Once | INTRAVENOUS | Status: AC | PRN
  Administered 2023-05-13: 75 mL via INTRAVENOUS

## 2023-05-17 DIAGNOSIS — I10 Essential (primary) hypertension: Secondary | ICD-10-CM | POA: Diagnosis not present

## 2023-05-17 DIAGNOSIS — E0590 Thyrotoxicosis, unspecified without thyrotoxic crisis or storm: Secondary | ICD-10-CM | POA: Diagnosis not present

## 2023-06-16 DIAGNOSIS — E7800 Pure hypercholesterolemia, unspecified: Secondary | ICD-10-CM | POA: Diagnosis not present

## 2023-06-16 DIAGNOSIS — R61 Generalized hyperhidrosis: Secondary | ICD-10-CM | POA: Diagnosis not present

## 2023-06-16 DIAGNOSIS — E0590 Thyrotoxicosis, unspecified without thyrotoxic crisis or storm: Secondary | ICD-10-CM | POA: Diagnosis not present

## 2023-06-16 DIAGNOSIS — I10 Essential (primary) hypertension: Secondary | ICD-10-CM | POA: Diagnosis not present

## 2023-06-22 DIAGNOSIS — R202 Paresthesia of skin: Secondary | ICD-10-CM | POA: Diagnosis not present

## 2023-06-22 DIAGNOSIS — E0590 Thyrotoxicosis, unspecified without thyrotoxic crisis or storm: Secondary | ICD-10-CM | POA: Diagnosis not present

## 2023-09-02 DIAGNOSIS — J0191 Acute recurrent sinusitis, unspecified: Secondary | ICD-10-CM | POA: Diagnosis not present

## 2023-09-02 DIAGNOSIS — J342 Deviated nasal septum: Secondary | ICD-10-CM | POA: Diagnosis not present

## 2023-09-13 DIAGNOSIS — J329 Chronic sinusitis, unspecified: Secondary | ICD-10-CM | POA: Diagnosis not present

## 2023-09-13 DIAGNOSIS — J0191 Acute recurrent sinusitis, unspecified: Secondary | ICD-10-CM | POA: Diagnosis not present

## 2023-09-16 DIAGNOSIS — J342 Deviated nasal septum: Secondary | ICD-10-CM | POA: Diagnosis not present

## 2023-09-16 DIAGNOSIS — J0191 Acute recurrent sinusitis, unspecified: Secondary | ICD-10-CM | POA: Diagnosis not present

## 2023-10-11 DIAGNOSIS — I10 Essential (primary) hypertension: Secondary | ICD-10-CM | POA: Diagnosis not present

## 2023-10-11 DIAGNOSIS — E7800 Pure hypercholesterolemia, unspecified: Secondary | ICD-10-CM | POA: Diagnosis not present

## 2023-10-11 DIAGNOSIS — E0590 Thyrotoxicosis, unspecified without thyrotoxic crisis or storm: Secondary | ICD-10-CM | POA: Diagnosis not present

## 2023-11-19 DIAGNOSIS — J3489 Other specified disorders of nose and nasal sinuses: Secondary | ICD-10-CM | POA: Diagnosis not present

## 2023-11-19 DIAGNOSIS — J342 Deviated nasal septum: Secondary | ICD-10-CM | POA: Diagnosis not present

## 2023-11-30 DIAGNOSIS — J342 Deviated nasal septum: Secondary | ICD-10-CM | POA: Diagnosis not present

## 2023-12-08 DIAGNOSIS — Z4889 Encounter for other specified surgical aftercare: Secondary | ICD-10-CM | POA: Diagnosis not present

## 2023-12-08 DIAGNOSIS — J342 Deviated nasal septum: Secondary | ICD-10-CM | POA: Diagnosis not present
